# Patient Record
Sex: Female | Born: 1935 | Race: White | Hispanic: No | Smoking: Never smoker
Health system: Southern US, Community
[De-identification: ages and names within clinical notes are randomized; demographics above are authoritative.]

## PROBLEM LIST (undated history)

## (undated) DIAGNOSIS — F0390 Unspecified dementia without behavioral disturbance: Secondary | ICD-10-CM

## (undated) DIAGNOSIS — I10 Essential (primary) hypertension: Secondary | ICD-10-CM

---

## 2004-02-14 ENCOUNTER — Other Ambulatory Visit: Payer: Self-pay

## 2004-02-14 ENCOUNTER — Ambulatory Visit: Payer: Self-pay | Admitting: Orthopaedic Surgery

## 2004-02-18 ENCOUNTER — Ambulatory Visit: Payer: Self-pay | Admitting: Orthopaedic Surgery

## 2004-12-18 ENCOUNTER — Ambulatory Visit: Payer: Self-pay | Admitting: Internal Medicine

## 2005-12-21 ENCOUNTER — Ambulatory Visit: Payer: Self-pay | Admitting: Internal Medicine

## 2007-02-23 ENCOUNTER — Ambulatory Visit: Payer: Self-pay | Admitting: Internal Medicine

## 2008-04-26 ENCOUNTER — Ambulatory Visit: Payer: Self-pay | Admitting: Internal Medicine

## 2009-05-23 ENCOUNTER — Ambulatory Visit: Payer: Self-pay | Admitting: Internal Medicine

## 2010-06-05 ENCOUNTER — Ambulatory Visit: Payer: Self-pay | Admitting: Internal Medicine

## 2011-09-03 ENCOUNTER — Ambulatory Visit: Payer: Self-pay | Admitting: Internal Medicine

## 2012-09-22 ENCOUNTER — Ambulatory Visit: Payer: Self-pay | Admitting: Internal Medicine

## 2013-09-25 ENCOUNTER — Ambulatory Visit: Payer: Self-pay | Admitting: Internal Medicine

## 2016-10-05 ENCOUNTER — Other Ambulatory Visit: Payer: Self-pay | Admitting: Neurology

## 2016-10-05 DIAGNOSIS — R413 Other amnesia: Secondary | ICD-10-CM

## 2016-10-08 ENCOUNTER — Ambulatory Visit
Admission: RE | Admit: 2016-10-08 | Discharge: 2016-10-08 | Disposition: A | Discharge status: Home / Self Care | Payer: Medicare Other | Source: Ambulatory Visit | Attending: Neurology | Admitting: Neurology

## 2016-10-08 ENCOUNTER — Encounter: Payer: Self-pay | Admitting: Radiology

## 2016-10-08 DIAGNOSIS — R413 Other amnesia: Secondary | ICD-10-CM

## 2018-09-06 ENCOUNTER — Emergency Department: Payer: Medicare Other

## 2018-09-06 ENCOUNTER — Other Ambulatory Visit: Payer: Self-pay

## 2018-09-06 ENCOUNTER — Encounter: Payer: Self-pay | Admitting: Emergency Medicine

## 2018-09-06 ENCOUNTER — Emergency Department
Admission: EM | Admit: 2018-09-06 | Discharge: 2018-09-06 | Disposition: A | Discharge status: Home / Self Care | Payer: Medicare Other | Attending: Emergency Medicine | Admitting: Emergency Medicine

## 2018-09-06 DIAGNOSIS — F0390 Unspecified dementia without behavioral disturbance: Secondary | ICD-10-CM | POA: Diagnosis not present

## 2018-09-06 DIAGNOSIS — R51 Headache: Secondary | ICD-10-CM | POA: Diagnosis not present

## 2018-09-06 DIAGNOSIS — R519 Headache, unspecified: Secondary | ICD-10-CM

## 2018-09-06 DIAGNOSIS — I10 Essential (primary) hypertension: Secondary | ICD-10-CM | POA: Insufficient documentation

## 2018-09-06 HISTORY — DX: Essential (primary) hypertension: I10

## 2018-09-06 HISTORY — DX: Unspecified dementia, unspecified severity, without behavioral disturbance, psychotic disturbance, mood disturbance, and anxiety: F03.90

## 2018-09-06 LAB — COMPREHENSIVE METABOLIC PANEL
ALT: 23 U/L (ref 0–44)
AST: 20 U/L (ref 15–41)
Albumin: 4 g/dL (ref 3.5–5.0)
Alkaline Phosphatase: 60 U/L (ref 38–126)
Anion gap: 8 (ref 5–15)
BUN: 25 mg/dL — ABNORMAL HIGH (ref 8–23)
CO2: 24 mmol/L (ref 22–32)
Calcium: 8.9 mg/dL (ref 8.9–10.3)
Chloride: 104 mmol/L (ref 98–111)
Creatinine, Ser: 0.71 mg/dL (ref 0.44–1.00)
GFR calc Af Amer: >60 mL/min (ref >60)
GFR calc non Af Amer: >60 mL/min (ref >60)
Glucose, Bld: 104 mg/dL — ABNORMAL HIGH (ref 70–99)
Potassium: 4.3 mmol/L (ref 3.5–5.1)
Sodium: 136 mmol/L (ref 135–145)
Total Bilirubin: 0.7 mg/dL (ref 0.3–1.2)
Total Protein: 7.3 g/dL (ref 6.5–8.1)

## 2018-09-06 LAB — CBC WITH DIFFERENTIAL/PLATELET
Abs Immature Granulocytes: 0.03 10*3/uL (ref 0.00–0.07)
Basophils Absolute: 0 10*3/uL (ref 0.0–0.1)
Basophils Relative: 0 %
Eosinophils Absolute: 0.1 10*3/uL (ref 0.0–0.5)
Eosinophils Relative: 1 %
HCT: 36.7 % (ref 36.0–46.0)
Hemoglobin: 12 g/dL (ref 12.0–15.0)
Immature Granulocytes: 0 %
Lymphocytes Relative: 9 %
Lymphs Abs: 0.7 10*3/uL (ref 0.7–4.0)
MCH: 32.2 pg (ref 26.0–34.0)
MCHC: 32.7 g/dL (ref 30.0–36.0)
MCV: 98.4 fL (ref 80.0–100.0)
Monocytes Absolute: 0.7 10*3/uL (ref 0.1–1.0)
Monocytes Relative: 9 %
Neutro Abs: 6 10*3/uL (ref 1.7–7.7)
Neutrophils Relative %: 81 %
Platelets: 275 10*3/uL (ref 150–400)
RBC: 3.73 MIL/uL — ABNORMAL LOW (ref 3.87–5.11)
RDW: 13.7 % (ref 11.5–15.5)
WBC: 7.5 10*3/uL (ref 4.0–10.5)
nRBC: 0 % (ref 0.0–0.2)

## 2018-09-06 LAB — URINALYSIS, COMPLETE (UACMP) WITH MICROSCOPIC
Bacteria, UA: NONE SEEN
Bilirubin Urine: NEGATIVE
Glucose, UA: NEGATIVE mg/dL
Hgb urine dipstick: NEGATIVE
Ketones, ur: NEGATIVE mg/dL
Leukocytes,Ua: NEGATIVE
Nitrite: NEGATIVE
Protein, ur: NEGATIVE mg/dL
Specific Gravity, Urine: 1.005 (ref 1.005–1.030)
pH: 6 (ref 5.0–8.0)

## 2018-09-06 NOTE — ED Notes (Signed)
Patient transported to CT 

## 2018-09-06 NOTE — ED Triage Notes (Signed)
Pt presents to ED via POV with her son who states that she has had headaches x 3 weeks, seen at PCP who prescribed Meloxicam without relief. Pt's son states he has been giving her advil and Tylenol without relief. Pt's son states he is healthcare power of attorney.

## 2018-09-06 NOTE — ED Notes (Signed)
Assisted pt to bathroom- pt had a steady gait and needed little assistance

## 2018-09-06 NOTE — ED Provider Notes (Signed)
[facility] Emergency Department Provider Note       Time seen: ----------------------------------------- 11:15 AM on 09/06/2018 -----------------------------------------   I have reviewed the triage vital signs and the nursing notes.  HISTORY   Chief Complaint Headache   HPI Dominique Walton is a 85 y.o. female with a history of dementia and hypertension who presents to the ED for persistent headaches for the past 3 weeks.  She has seen her primary care doctor who prescribed meloxicam without any relief.  Son states he has been giving her Advil and Tylenol without any improvement.  Past Medical History:  Diagnosis Date  . Dementia (HCC)   . Hypertension     There are no active problems to display for this patient.   History reviewed. No pertinent surgical history.  Allergies Patient has no known allergies.  Social History Social History   Tobacco Use  . Smoking status: Never Smoker  . Smokeless tobacco: Never Used  Substance Use Topics  . Alcohol use: Not Currently  . Drug use: Not Currently   Review of Systems Constitutional: Negative for fever. Cardiovascular: Negative for chest pain. Respiratory: Negative for shortness of breath. Gastrointestinal: Negative for abdominal pain, vomiting and diarrhea. Musculoskeletal: Negative for back pain. Skin: Negative for rash. Neurological: Positive for headache  All systems negative/normal/unremarkable except as stated in the HPI  ____________________________________________   PHYSICAL EXAM:  VITAL SIGNS: ED Triage Vitals  Enc Vitals Group     BP 09/06/18 1108 (!) 171/92     Pulse Rate 09/06/18 1108 88     Resp 09/06/18 1108 18     Temp 09/06/18 1108 98.1 F (36.7 C)     Temp Source 09/06/18 1108 Oral     SpO2 09/06/18 1108 100 %     Weight 09/06/18 1103 108 lb 12.8 oz (49.4 kg)     Height 09/06/18 1103 5\' 3"  (1.6 m)     Head Circumference --      Peak Flow --      Pain Score --       Pain Loc --      Pain Edu? --      Excl. in GC? --     Constitutional: Alert but disoriented.. Well appearing and in no distress. Eyes: Conjunctivae are normal. Normal extraocular movements. ENT      Head: Normocephalic and atraumatic.      Nose: No congestion/rhinnorhea.      Mouth/Throat: Mucous membranes are moist.      Neck: No stridor. Cardiovascular: Normal rate, regular rhythm. No murmurs, rubs, or gallops. Respiratory: Normal respiratory effort without tachypnea nor retractions. Breath sounds are clear and equal bilaterally. No wheezes/rales/rhonchi. Gastrointestinal: Soft and nontender. Normal bowel sounds Musculoskeletal: Nontender with normal range of motion in extremities. No lower extremity tenderness nor edema. Neurologic:  Normal speech and language. No gross focal neurologic deficits are appreciated.  Skin:  Skin is warm, dry and intact. No rash noted. Psychiatric: Mood and affect are normal. Speech and behavior are normal.  ____________________________________________  ED COURSE:  As part of my medical decision making, I reviewed the following data within the electronic MEDICAL RECORD NUMBER History obtained from family if available, nursing notes, old chart and ekg, as well as notes from prior ED visits. Patient presented for persistent headache, we will assess with labs and imaging as indicated at this time.   Procedures  Dominique Walton was evaluated in Emergency Department on 09/06/2018 for the symptoms described in the  history of present illness. She was evaluated in the context of the global COVID-19 pandemic, which necessitated consideration that the patient might be at risk for infection with the SARS-CoV-2 virus that causes COVID-19. Institutional protocols and algorithms that pertain to the evaluation of patients at risk for COVID-19 are in a state of rapid change based on information released by regulatory bodies including the CDC and federal and state organizations.  These policies and algorithms were followed during the patient's care in the ED.  ____________________________________________   LABS (pertinent positives/negatives)  Labs Reviewed  CBC WITH DIFFERENTIAL/PLATELET - Abnormal; Notable for the following components:      Result Value   RBC 3.73 (*)    All other components within normal limits  COMPREHENSIVE METABOLIC PANEL - Abnormal; Notable for the following components:   Glucose, Bld 104 (*)    BUN 25 (*)    All other components within normal limits  URINALYSIS, COMPLETE (UACMP) WITH MICROSCOPIC - Abnormal; Notable for the following components:   Color, Urine STRAW (*)    APPearance CLEAR (*)    All other components within normal limits    RADIOLOGY Images were viewed by me  CT head without contrast IMPRESSION: Mild chronic ischemic white matter disease. No acute intracranial abnormality seen. ____________________________________________   DIFFERENTIAL DIAGNOSIS   Dementia, tension headache, migraine, subdural, brain tumor  FINAL ASSESSMENT AND PLAN  Headache   Plan: The patient had presented for persistent headache. Patient's labs did not reveal any acute process. Patient's imaging was negative.  She is cleared for outpatient follow-up, no clear etiology for her headache.   Ulice Dash, MD    Note: This note was generated in part or whole with voice recognition software. Voice recognition is usually quite accurate but there are transcription errors that can and very often do occur. I apologize for any typographical errors that were not detected and corrected.     Emily Filbert, MD 09/06/18 [phone]

## 2018-09-06 NOTE — ED Notes (Signed)
Pt having headaches that come and go for 3 weeks- medications not helping- pts son states that the pt c/o of a bump on the back of her head behind her right ear but he and the dr have not noticed anything- pt has a hx of dementia

## 2018-09-06 NOTE — ED Notes (Signed)
EDP at bedside  

## 2018-09-19 ENCOUNTER — Telehealth: Payer: Self-pay | Admitting: Primary Care

## 2018-09-19 NOTE — Telephone Encounter (Signed)
Talked with Olivia Mackie (daughter-in-law) regarding scheduling Palliative Consult, after answering several questions about Palliative services she wanted to talk with her husband first and call me back to schedule.

## 2018-09-19 NOTE — Telephone Encounter (Signed)
Rec'd call back from [place] and we have scheduled a Telephone Palliative consult for 09/26/18 @ 3 PM.

## 2018-09-26 ENCOUNTER — Other Ambulatory Visit: Payer: Self-pay

## 2018-09-26 ENCOUNTER — Other Ambulatory Visit: Payer: Medicare Other | Admitting: Primary Care

## 2018-09-26 DIAGNOSIS — Z515 Encounter for palliative care: Secondary | ICD-10-CM

## 2018-09-26 NOTE — Progress Notes (Signed)
[facility] Pall[facility] Consult Note Telephone: [phone]  Fax: [phone]   PATIENT NAME: Dominique Walton DOB: 13-Feb-1927 MRN: [id number]  PRIMARY CARE PROVIDER:   Rusty Aus, [place]  REFERRING PROVIDER:  Rusty Aus, MD [place],  [place] [address] [phone]  RESPONSIBLE PARTY:   Extended Emergency Contact Information Primary Emergency Contact: Freiberger,[name] Address: [address]          [place], [place] [address] [name] of [place] Phone: [phone] Relation: Son Secondary Emergency Contact: [name],[name] Address: [address]          [place], [place] [address] [name] of [place] Phone: [phone] Relation: Son  Palliative Care was asked to follow this patient by consultation request of Rusty Aus, MD. This is the initial visit. Present were patient, daughter in law [name], 2 sons [name] and [name],  [name]'s girlfriend and patient.  ASSESSMENT AND RECOMMENDATIONS:   1. Goals of Care: Maximize quality of life and symptom management. Needs immediate help with caregiving issues as well as safety.  2. Symptom Management:   Dementia: Recommend reinitiating of quetiapine 12.5-25 mg at hs and holding namenda for now, see below. Patient developed a head ache and family stopped both these  medications. Today we talked about starting with just the quetiapine at a low dose and titrating up observing for side effects such as headache and vertigo. Family will give this trial 2 weeks. If no untoward effects, I have recommended revisiting for dose adjustment. If behavior is not mitigated, I would recommend a trial of depakote 125 mg bid.   Wandering: Had been tried on quetiapine and namenda but began to complain of a headache and went to ED. Family has stopped medications. However, behaviors have been worsening RE wandering looking for family members, often people who  have already passed on. Or, she is looking for siblings as if they were all children. She exhibits some sundowning behaviors. Education provided on manifestations of dementia.  Education provided RE safety measures such as door alarms because patient becomes frantic if someone is not with her at all times. She is a risk for wandering.  Weekly bathing: Patient has neglected personal hygiene and resists having a weekly bath much of the time. Her daughter in law coaxes her with a weekly bath. Family would like more custodial support.  3. Family /Caregiver/Community Supports: Discussed at length the burden of 24/7 care giving as patient's dementia continues to progress. Family cited she has declined in past several months, not able to be left at home now. The patient states she drives, cooks, does dishes, bathes independently but when asked her age she could not give year or age, and family endorses her inability to do most of these adls. She resists bathing and toilet hygiene assistance and they are exhausted from the care giving demands. We  Discussed placement as an option, and discussed her finances and ability to qualify for medicaid.   Family will research area dementia care facilities and will make application for Medicaid for placement. Her care level will be Assisted/memory care. We discussed Covid and the limitations that nursing homes have put on any visitors, and how they could potentially not see her for a long time. They were reticent to place her and then effectively disappear. They have modest means and may be able to hire someone to provide some respite for the caregivers.  We discussed [facility] (  [phone]) and other day programs. They are not sure if patient would be able to take the stress of a new situation but I encouraged her to look into it.   4. Cognitive / Functional decline: Increasing per family report, now FAST score 6 b-c, also has vascular aspects as her dementia is mixed. Family  states she can no longer be left alone for a few hours due to wandering and anxiety at being alone.She is also getting up in the night at 2,3 4 am and looking for family members. She sometimes does not recognize her caregivers, her sons. She seems to be reporting some hallucinations, thinking there are additional people in the house, especially at night. Patient herself reported ability to drive, pay her bills, cook, bathe and dress self, but this was denied by family. Patient could also not tell me her age or birth year.  5. Advanced Care Directive: Sons [name] and [name] are POA health care and durable. They identified a living will that was made by the patient. They will review that form and I gave them a MOST form to discuss and complete and we can discuss their care goals on our next meeting.  6. Follow up Palliative Care Visit: Palliative care will continue to follow for goals of care clarification and symptom management. Return 2 weeks or prn. We will look at medication effect, placement options, caregiver augmentation and safety measures. We will complete a MOST form and DSS application for medicaid.  Palliative medicine team will continue to support patient, patient's family, and medical team. Visit consisted of counseling and education dealing with the complex and emotionally intense issues of symptom management and palliative care in the setting of serious and potentially life-threatening illness.  I spent 120 minutes providing this consultation,  from 1500 to 1700. More than 50% of the time in this consultation was spent coordinating communication.   HISTORY OF PRESENT ILLNESS:  Dominique Walton is a 85 y.o. year old female with multiple medical problems including dementia with behavioral disturbances, HTN. Palliative Care was asked to help address goals of care.   CODE STATUS: DNR, not sure where it is, will review MOST next visit.  PPS: 40% HOSPICE ELIGIBILITY/DIAGNOSIS: TBD  PAST MEDICAL  HISTORY:  Past Medical History:  Diagnosis Date   Dementia (HCC)    Hypertension     SOCIAL HX:  Social History   Tobacco Use   Smoking status: Never Smoker   Smokeless tobacco: Never Used  Substance Use Topics   Alcohol use: Not Currently    ALLERGIES: No Known Allergies   PERTINENT MEDICATIONS:  No outpatient encounter medications on file as of 09/26/2018.   No facility-administered encounter medications on file as of 09/26/2018.     PHYSICAL EXAM/ROS:   T=98.5 HR= 78 Current and past weights: current weight = 109 lb,  General: NAD, frail appearing, thin, states she dresses self, does dishes, states she is driving. States she does all her bills and business activities. This is all false, she is not able due to dementia. Family goes to MD with patient.  Cardiovascular: no chest pain reported, no edema, + aortic murmur III/IV. Pulmonary: no cough, no increased SOB Abdomen: appetite fair, denies constipation, continent of bowel GU: denies dysuria,  Stress incontinence at times MSK:  no joint deformities, does not use device for ambulation, fall history with 3 recent falls. Skin: no rashes or wounds reported Neurological: Weakness, was in ED with headache, states no headache now and no  knowledge of the ED event but  states no feeling of memory loss or hallucinations. Wandering and states she wants to go home. Wandering in the night in the house looking for people.  FAST Score 6 c.   Marijo FileKathryn M Debi Cousin DNP, AGPCNP-BC

## 2018-11-22 ENCOUNTER — Telehealth: Payer: Self-pay | Admitting: Primary Care

## 2018-11-22 NOTE — Telephone Encounter (Signed)
Email from [facility] scheduling. They are doing fine for now and would like a call in a few months. Will reach out for scheduling in mid Oct. And encouraged them to call if needed.

## 2019-01-26 ENCOUNTER — Other Ambulatory Visit
Admission: RE | Admit: 2019-01-26 | Discharge: 2019-01-26 | Disposition: A | Discharge status: Home / Self Care | Payer: Medicare Other | Source: Ambulatory Visit | Attending: Student | Admitting: Student

## 2019-01-26 DIAGNOSIS — L0390 Cellulitis, unspecified: Secondary | ICD-10-CM | POA: Diagnosis present

## 2019-01-26 LAB — SYNOVIAL CELL COUNT + DIFF, W/ CRYSTALS
Crystals, Fluid: NONE SEEN
Eosinophils-Synovial: 2 %
Lymphocytes-Synovial Fld: 8 %
Monocyte-Macrophage-Synovial Fluid: 5 %
Neutrophil, Synovial: 85 %
WBC, Synovial: 4643 /mm3 — ABNORMAL HIGH (ref 0–200)

## 2019-08-17 ENCOUNTER — Ambulatory Visit: Payer: Medicare PPO | Attending: Internal Medicine

## 2019-08-17 DIAGNOSIS — Z20822 Contact with and (suspected) exposure to covid-19: Secondary | ICD-10-CM

## 2019-08-18 LAB — SARS-COV-2, NAA 2 DAY TAT

## 2019-08-18 LAB — NOVEL CORONAVIRUS, NAA: SARS-CoV-2, NAA: NOT DETECTED

## 2019-11-26 ENCOUNTER — Emergency Department: Payer: Medicare PPO

## 2019-11-26 ENCOUNTER — Inpatient Hospital Stay
Admission: EM | Admit: 2019-11-26 | Discharge: 2019-12-04 | DRG: 602 | Disposition: A | Discharge status: Skilled Nursing Facility | Payer: Medicare PPO | Source: Skilled Nursing Facility | Attending: Internal Medicine | Admitting: Internal Medicine

## 2019-11-26 ENCOUNTER — Encounter: Payer: Self-pay | Admitting: Emergency Medicine

## 2019-11-26 DIAGNOSIS — G301 Alzheimer's disease with late onset: Secondary | ICD-10-CM | POA: Diagnosis not present

## 2019-11-26 DIAGNOSIS — I350 Nonrheumatic aortic (valve) stenosis: Secondary | ICD-10-CM

## 2019-11-26 DIAGNOSIS — I110 Hypertensive heart disease with heart failure: Secondary | ICD-10-CM | POA: Diagnosis present

## 2019-11-26 DIAGNOSIS — I4891 Unspecified atrial fibrillation: Secondary | ICD-10-CM | POA: Diagnosis present

## 2019-11-26 DIAGNOSIS — Z7982 Long term (current) use of aspirin: Secondary | ICD-10-CM

## 2019-11-26 DIAGNOSIS — I252 Old myocardial infarction: Secondary | ICD-10-CM

## 2019-11-26 DIAGNOSIS — E876 Hypokalemia: Secondary | ICD-10-CM | POA: Diagnosis not present

## 2019-11-26 DIAGNOSIS — Z515 Encounter for palliative care: Secondary | ICD-10-CM | POA: Diagnosis not present

## 2019-11-26 DIAGNOSIS — Z20822 Contact with and (suspected) exposure to covid-19: Secondary | ICD-10-CM | POA: Diagnosis present

## 2019-11-26 DIAGNOSIS — S0083XA Contusion of other part of head, initial encounter: Secondary | ICD-10-CM | POA: Diagnosis present

## 2019-11-26 DIAGNOSIS — F0280 Dementia in other diseases classified elsewhere without behavioral disturbance: Secondary | ICD-10-CM

## 2019-11-26 DIAGNOSIS — F0150 Vascular dementia without behavioral disturbance: Secondary | ICD-10-CM | POA: Diagnosis not present

## 2019-11-26 DIAGNOSIS — W19XXXA Unspecified fall, initial encounter: Secondary | ICD-10-CM | POA: Diagnosis present

## 2019-11-26 DIAGNOSIS — I5033 Acute on chronic diastolic (congestive) heart failure: Secondary | ICD-10-CM

## 2019-11-26 DIAGNOSIS — G309 Alzheimer's disease, unspecified: Secondary | ICD-10-CM | POA: Diagnosis present

## 2019-11-26 DIAGNOSIS — J45909 Unspecified asthma, uncomplicated: Secondary | ICD-10-CM | POA: Diagnosis present

## 2019-11-26 DIAGNOSIS — Z6824 Body mass index (BMI) 24.0-24.9, adult: Secondary | ICD-10-CM

## 2019-11-26 DIAGNOSIS — Z79899 Other long term (current) drug therapy: Secondary | ICD-10-CM | POA: Diagnosis not present

## 2019-11-26 DIAGNOSIS — F0281 Dementia in other diseases classified elsewhere with behavioral disturbance: Secondary | ICD-10-CM | POA: Diagnosis present

## 2019-11-26 DIAGNOSIS — R339 Retention of urine, unspecified: Secondary | ICD-10-CM | POA: Diagnosis not present

## 2019-11-26 DIAGNOSIS — I10 Essential (primary) hypertension: Secondary | ICD-10-CM | POA: Diagnosis not present

## 2019-11-26 DIAGNOSIS — E870 Hyperosmolality and hypernatremia: Secondary | ICD-10-CM

## 2019-11-26 DIAGNOSIS — F419 Anxiety disorder, unspecified: Secondary | ICD-10-CM | POA: Diagnosis present

## 2019-11-26 DIAGNOSIS — J9601 Acute respiratory failure with hypoxia: Secondary | ICD-10-CM | POA: Diagnosis present

## 2019-11-26 DIAGNOSIS — D509 Iron deficiency anemia, unspecified: Secondary | ICD-10-CM | POA: Diagnosis present

## 2019-11-26 DIAGNOSIS — R627 Adult failure to thrive: Secondary | ICD-10-CM | POA: Diagnosis not present

## 2019-11-26 DIAGNOSIS — Z9183 Wandering in diseases classified elsewhere: Secondary | ICD-10-CM

## 2019-11-26 DIAGNOSIS — S0011XA Contusion of right eyelid and periocular area, initial encounter: Secondary | ICD-10-CM | POA: Diagnosis present

## 2019-11-26 DIAGNOSIS — F05 Delirium due to known physiological condition: Secondary | ICD-10-CM | POA: Diagnosis present

## 2019-11-26 DIAGNOSIS — L03116 Cellulitis of left lower limb: Secondary | ICD-10-CM | POA: Diagnosis not present

## 2019-11-26 DIAGNOSIS — E871 Hypo-osmolality and hyponatremia: Secondary | ICD-10-CM | POA: Diagnosis not present

## 2019-11-26 DIAGNOSIS — Z66 Do not resuscitate: Secondary | ICD-10-CM | POA: Diagnosis not present

## 2019-11-26 LAB — CBC WITH DIFFERENTIAL/PLATELET
Abs Immature Granulocytes: 0.03 10*3/uL (ref 0.00–0.07)
Basophils Absolute: 0 10*3/uL (ref 0.0–0.1)
Basophils Relative: 1 %
Eosinophils Absolute: 0.2 10*3/uL (ref 0.0–0.5)
Eosinophils Relative: 3 %
HCT: 31.4 % — ABNORMAL LOW (ref 36.0–46.0)
Hemoglobin: 10.6 g/dL — ABNORMAL LOW (ref 12.0–15.0)
Immature Granulocytes: 0 %
Lymphocytes Relative: 8 %
Lymphs Abs: 0.6 10*3/uL — ABNORMAL LOW (ref 0.7–4.0)
MCH: 33.3 pg (ref 26.0–34.0)
MCHC: 33.8 g/dL (ref 30.0–36.0)
MCV: 98.7 fL (ref 80.0–100.0)
Monocytes Absolute: 0.9 10*3/uL (ref 0.1–1.0)
Monocytes Relative: 11 %
Neutro Abs: 6.1 10*3/uL (ref 1.7–7.7)
Neutrophils Relative %: 77 %
Platelets: 224 10*3/uL (ref 150–400)
RBC: 3.18 MIL/uL — ABNORMAL LOW (ref 3.87–5.11)
RDW: 14.9 % (ref 11.5–15.5)
WBC: 7.9 10*3/uL (ref 4.0–10.5)
nRBC: 0 % (ref 0.0–0.2)

## 2019-11-26 LAB — URINALYSIS, COMPLETE (UACMP) WITH MICROSCOPIC
Bilirubin Urine: NEGATIVE
Glucose, UA: NEGATIVE mg/dL
Hgb urine dipstick: NEGATIVE
Ketones, ur: 5 mg/dL — AB
Nitrite: NEGATIVE
Protein, ur: NEGATIVE mg/dL
Specific Gravity, Urine: 1.014 (ref 1.005–1.030)
pH: 5 (ref 5.0–8.0)

## 2019-11-26 LAB — COMPREHENSIVE METABOLIC PANEL
ALT: 22 U/L (ref 0–44)
AST: 29 U/L (ref 15–41)
Albumin: 4 g/dL (ref 3.5–5.0)
Alkaline Phosphatase: 59 U/L (ref 38–126)
Anion gap: 12 (ref 5–15)
BUN: 41 mg/dL — ABNORMAL HIGH (ref 8–23)
CO2: 24 mmol/L (ref 22–32)
Calcium: 9 mg/dL (ref 8.9–10.3)
Chloride: 104 mmol/L (ref 98–111)
Creatinine, Ser: 0.88 mg/dL (ref 0.44–1.00)
GFR calc Af Amer: >60 mL/min (ref >60)
GFR calc non Af Amer: 57 mL/min — ABNORMAL LOW (ref >60)
Glucose, Bld: 100 mg/dL — ABNORMAL HIGH (ref 70–99)
Potassium: 4.2 mmol/L (ref 3.5–5.1)
Sodium: 140 mmol/L (ref 135–145)
Total Bilirubin: 1 mg/dL (ref 0.3–1.2)
Total Protein: 7.7 g/dL (ref 6.5–8.1)

## 2019-11-26 LAB — SARS CORONAVIRUS 2 BY RT PCR (HOSPITAL ORDER, PERFORMED IN ~~LOC~~ HOSPITAL LAB): SARS Coronavirus 2: NEGATIVE

## 2019-11-26 LAB — PROTIME-INR
INR: 0.9 (ref 0.8–1.2)
Prothrombin Time: 12.1 seconds (ref 11.4–15.2)

## 2019-11-26 LAB — APTT: aPTT: 29 seconds (ref 24–36)

## 2019-11-26 LAB — LACTIC ACID, PLASMA: Lactic Acid, Venous: 0.8 mmol/L (ref 0.5–1.9)

## 2019-11-26 MED ORDER — ACETAMINOPHEN 325 MG PO TABS
650.0000 mg | ORAL_TABLET | Freq: Four times a day (QID) | ORAL | Status: DC | PRN
  Administered 2019-11-27 – 2019-12-03 (×2): 650 mg via ORAL
  Filled 2019-11-26 (×2): qty 2

## 2019-11-26 MED ORDER — FUROSEMIDE 40 MG PO TABS
40.0000 mg | ORAL_TABLET | Freq: Every day | ORAL | Status: DC
  Administered 2019-11-26 – 2019-11-28 (×2): 40 mg via ORAL
  Filled 2019-11-26 (×2): qty 1

## 2019-11-26 MED ORDER — SODIUM CHLORIDE 0.9% FLUSH
3.0000 mL | Freq: Two times a day (BID) | INTRAVENOUS | Status: DC
  Administered 2019-11-26 – 2019-12-02 (×10): 3 mL via INTRAVENOUS

## 2019-11-26 MED ORDER — GALANTAMINE HYDROBROMIDE ER 8 MG PO CP24
8.0000 mg | ORAL_CAPSULE | Freq: Every day | ORAL | Status: DC
  Administered 2019-11-26 – 2019-12-04 (×8): 8 mg via ORAL
  Filled 2019-11-26 (×10): qty 1

## 2019-11-26 MED ORDER — QUETIAPINE FUMARATE 25 MG PO TABS
25.0000 mg | ORAL_TABLET | Freq: Every morning | ORAL | Status: DC
  Administered 2019-11-28 – 2019-12-01 (×4): 25 mg via ORAL
  Filled 2019-11-26 (×4): qty 1

## 2019-11-26 MED ORDER — QUETIAPINE FUMARATE 25 MG PO TABS
50.0000 mg | ORAL_TABLET | Freq: Every day | ORAL | Status: DC
  Administered 2019-11-27 – 2019-11-30 (×4): 50 mg via ORAL
  Filled 2019-11-26 (×5): qty 2

## 2019-11-26 MED ORDER — QUETIAPINE FUMARATE 25 MG PO TABS
25.0000 mg | ORAL_TABLET | Freq: Two times a day (BID) | ORAL | Status: DC
  Administered 2019-11-26: 50 mg via ORAL
  Filled 2019-11-26: qty 2

## 2019-11-26 MED ORDER — ALBUTEROL SULFATE HFA 108 (90 BASE) MCG/ACT IN AERS
2.0000 | INHALATION_SPRAY | Freq: Four times a day (QID) | RESPIRATORY_TRACT | Status: DC | PRN
  Filled 2019-11-26: qty 6.7

## 2019-11-26 MED ORDER — ONDANSETRON HCL 4 MG/2ML IJ SOLN: 4.0000 mg | Freq: Four times a day (QID) | INTRAMUSCULAR | Status: DC | PRN

## 2019-11-26 MED ORDER — ALPRAZOLAM 0.25 MG PO TABS
0.2500 mg | ORAL_TABLET | Freq: Two times a day (BID) | ORAL | Status: DC | PRN
  Administered 2019-11-26 – 2019-11-27 (×2): 0.25 mg via ORAL
  Filled 2019-11-26 (×2): qty 1

## 2019-11-26 MED ORDER — QUETIAPINE FUMARATE 25 MG PO TABS
25.0000 mg | ORAL_TABLET | Freq: Once | ORAL | Status: AC | PRN
  Administered 2019-11-26: 25 mg via ORAL
  Filled 2019-11-26: qty 1

## 2019-11-26 MED ORDER — SODIUM CHLORIDE 0.9 % IV SOLN
1.0000 g | Freq: Once | INTRAVENOUS | Status: AC
  Administered 2019-11-26: 1 g via INTRAVENOUS
  Filled 2019-11-26: qty 10

## 2019-11-26 MED ORDER — ACETAMINOPHEN 650 MG RE SUPP: 650.0000 mg | Freq: Four times a day (QID) | RECTAL | Status: DC | PRN

## 2019-11-26 MED ORDER — VITAMIN D3 25 MCG (1000 UNIT) PO TABS
1000.0000 [IU] | ORAL_TABLET | Freq: Every day | ORAL | Status: DC
  Administered 2019-11-28 – 2019-12-04 (×7): 1000 [IU] via ORAL
  Filled 2019-11-26 (×16): qty 1

## 2019-11-26 MED ORDER — CEFAZOLIN SODIUM-DEXTROSE 1-4 GM/50ML-% IV SOLN
1.0000 g | Freq: Three times a day (TID) | INTRAVENOUS | Status: DC
  Administered 2019-11-27 – 2019-11-30 (×10): 1 g via INTRAVENOUS
  Filled 2019-11-26 (×18): qty 50

## 2019-11-26 MED ORDER — VITAMIN B-12 1000 MCG PO TABS
1000.0000 ug | ORAL_TABLET | Freq: Every day | ORAL | Status: DC
  Administered 2019-11-28 – 2019-12-04 (×7): 1000 ug via ORAL
  Filled 2019-11-26 (×9): qty 1

## 2019-11-26 MED ORDER — BENAZEPRIL HCL 10 MG PO TABS
10.0000 mg | ORAL_TABLET | Freq: Every day | ORAL | Status: DC
  Administered 2019-11-26 – 2019-12-04 (×7): 10 mg via ORAL
  Filled 2019-11-26 (×9): qty 1

## 2019-11-26 MED ORDER — ENOXAPARIN SODIUM 40 MG/0.4ML ~~LOC~~ SOLN
40.0000 mg | SUBCUTANEOUS | Status: DC
  Administered 2019-11-27 – 2019-12-03 (×8): 40 mg via SUBCUTANEOUS
  Filled 2019-11-26 (×8): qty 0.4

## 2019-11-26 MED ORDER — TRAZODONE HCL 50 MG PO TABS
50.0000 mg | ORAL_TABLET | Freq: Every evening | ORAL | Status: DC | PRN
  Administered 2019-11-27 – 2019-11-30 (×2): 50 mg via ORAL
  Filled 2019-11-26 (×3): qty 1

## 2019-11-26 MED ORDER — ASPIRIN EC 81 MG PO TBEC
81.0000 mg | DELAYED_RELEASE_TABLET | Freq: Every day | ORAL | Status: DC
  Administered 2019-11-26 – 2019-12-04 (×8): 81 mg via ORAL
  Filled 2019-11-26 (×8): qty 1

## 2019-11-26 MED ORDER — AMLODIPINE BESYLATE 10 MG PO TABS
10.0000 mg | ORAL_TABLET | Freq: Every day | ORAL | Status: DC
  Administered 2019-11-26 – 2019-12-04 (×7): 10 mg via ORAL
  Filled 2019-11-26 (×2): qty 1
  Filled 2019-11-26: qty 2
  Filled 2019-11-26 (×5): qty 1

## 2019-11-26 MED ORDER — ONDANSETRON HCL 4 MG PO TABS: 4.0000 mg | ORAL_TABLET | Freq: Four times a day (QID) | ORAL | Status: DC | PRN

## 2019-11-26 MED ORDER — POLYETHYLENE GLYCOL 3350 17 G PO PACK: 17.0000 g | PACK | Freq: Every day | ORAL | Status: DC | PRN

## 2019-11-26 NOTE — H&P (Signed)
[facility] History and Physical  Dominique Walton DSK:[id number] DOB: 03/10/1927 DOA: 11/26/2019  Referring physician: Dr. Manson Passey PCP: Danella Penton, MD   Chief Complaint: fall  HPI: Dominique Walton is a 86 y.o. female with hx of dementia, hypertension, aortic stenosis, A. fib, who presents after fall.   History obtained via chart due to patient's dementia.  When interviewed at bedside patient states she does not remember falling and is unable to give a clear answer on how her leg feels.  Call made to [facility], dayshift was not present during her fall but they received report that patient had fell.  They states she often wanders.  Per ED provider patient presented after unwitnessed fall from [facility] signs of trauma.  In the ED she underwent basic lab work-up which showed unremarkable CMP and CBC with mild anemia hemoglobin 10.6, normal coags, and CT head & spine which was negative for any acute findings with exception of scalp hematoma.  She also had plain films of chest, hip, lumbar spine, all of which were also negative for acute findings.  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  Remainder of exam is notable for left lower leg findings that were consistent with cellulitis.  She was given 1 g of ceftriaxone in the ED.  She was admitted for further management of her left lower extremity cellulitis.   Review of Systems:  Unable to perform due to condition of patient: Dementia   Past Medical History:  Diagnosis Date  . Dementia (HCC)   . Hypertension    History reviewed. No pertinent surgical history. Social History:  reports that she has never smoked. She has never used smokeless tobacco. She reports previous alcohol use. She reports previous drug use.  No Known Allergies  History reviewed. No pertinent family history.   Prior to Admission medications   Medication Sig Start Date End Date Taking?  Authorizing Provider  acetaminophen (TYLENOL) 325 MG tablet Take 650 mg by mouth 3 (three) times daily.   Yes [provider]  albuterol (VENTOLIN HFA) 108 (90 Base) MCG/ACT inhaler Inhale 2 puffs into the lungs every 6 (six) hours as needed for shortness of breath.   Yes [provider]  ALPRAZolam (XANAX) 0.25 MG tablet Take 0.25 mg by mouth every 12 (twelve) hours as needed for anxiety.   Yes [provider]  amLODipine (NORVASC) 10 MG tablet Take 10 mg by mouth daily.   Yes [provider]  aspirin EC 81 MG tablet Take 81 mg by mouth daily. Swallow whole.   Yes [provider]  benazepril (LOTENSIN) 10 MG tablet Take 10 mg by mouth daily.   Yes [provider]  cetirizine (ZYRTEC) 10 MG tablet Take 10 mg by mouth daily as needed for allergies.   Yes [provider]  cholecalciferol (VITAMIN D) 25 MCG (1000 UNIT) tablet Take 1,000 Units by mouth daily.   Yes [provider]  cloNIDine (CATAPRES) 0.1 MG tablet Take 0.1 mg by mouth every 12 (twelve) hours as needed (SBP >180).   Yes [provider]  Emollient Juanell Fairly) LOTN Apply 1 application topically daily. Apply to bilateral lower extremities   Yes [provider]  Emollient (MINERIN) LOTN Apply 1 application topically daily as needed (dry skin). Apply to bilateral lower extremities   Yes [provider]  furosemide (LASIX) 40 MG tablet Take 40 mg by mouth daily.   Yes  [provider]  galantamine (RAZADYNE ER) 8 MG 24 hr capsule Take 8 mg by mouth daily with breakfast.   Yes [provider]  ibuprofen (ADVIL) 200 MG tablet Take 200 mg by mouth as needed for mild pain.   Yes [provider]  QUEtiapine (SEROQUEL) 25 MG tablet Take 25-50 mg by mouth 2 (two) times daily. 25 mg in the morning and 50 mg at bedtime   Yes [provider]  vitamin B-12 (CYANOCOBALAMIN) 1000 MCG tablet Take 1,000 mcg by mouth daily.    Yes [provider]   Physical Exam: Vitals:   11/26/19 0449  BP: (!) 150/98  Resp: 20  Temp: 98 F (36.7 C)  TempSrc: Axillary  SpO2: 95%    Wt Readings from Last 3 Encounters:  09/06/18 49.4 kg    General:  Appears calm and comfortable Eyes: PERRL, normal lids, irises & conjunctiva ENT: grossly normal hearing, poor oral care Neck: no masses Cardiovascular: RRR, 3/6 systolic murmur. Minimal LE edema Respiratory: CTA bilaterally, no w/r/r. Normal respiratory effort. Abdomen: soft, ntnd Skin: erythema of LLE present over foot up to mid shin with minimal edema as well as skin tear over lateral L thigh Musculoskeletal: grossly normal tone BUE/BLE Psychiatric: anxious affect, speech fluent and appropriate Neurologic: grossly non-focal.          Labs on Admission:  Basic Metabolic Panel: Recent Labs  Lab 11/26/19 0517  NA 140  K 4.2  CL 104  CO2 24  GLUCOSE 100*  BUN 41*  CREATININE 0.88  CALCIUM 9.0   Liver Function Tests: Recent Labs  Lab 11/26/19 0517  AST 29  ALT 22  ALKPHOS 59  BILITOT 1.0  PROT 7.7  ALBUMIN 4.0   No results for input(s): LIPASE, AMYLASE in the last 168 hours. No results for input(s): AMMONIA in the last 168 hours. CBC: Recent Labs  Lab 11/26/19 0517  WBC 7.9  NEUTROABS 6.1  HGB 10.6*  HCT 31.4*  MCV 98.7  PLT 224   Cardiac Enzymes: No results for input(s): CKTOTAL, CKMB, CKMBINDEX, TROPONINI in the last 168 hours.  BNP (last 3 results) No results for input(s): BNP in the last 8760 hours.  ProBNP (last 3 results) No results for input(s): PROBNP in the last 8760 hours.  CBG: No results for input(s): GLUCAP in the last 168 hours.  Radiological Exams on Admission: DG Lumbar Spine Complete  Result Date: 11/26/2019 CLINICAL DATA:  Status post fall with low back pain. EXAM: LUMBAR SPINE - COMPLETE 4+ VIEW COMPARISON:  None. FINDINGS: There is lumbar scoliosis which appears convex towards the right. First degree  anterolisthesis of L4 on L5 noted. Vertebral body heights are well preserved. No fractures identified. Multi level disc space narrowing and endplate spurring noted. This is most advanced at L2-3. Bilateral L4-5 and L5-S1 facet arthropathy. Atherosclerotic calcifications are noted involving the abdominal aorta. IMPRESSION: 1. No acute findings. 2. Lumbar scoliosis and degenerative disc disease. 3. Bilateral L4-5 and L5-S1 facet arthropathy. 4.  Aortic Atherosclerosis (ICD10-I70.0). Electronically Signed   By: Signa Kell M.D.   On: 11/26/2019 05:28   CT Head Wo Contrast  Result Date: 11/26/2019 CLINICAL DATA:  Head trauma.  Falls. EXAM: CT HEAD WITHOUT CONTRAST CT CERVICAL SPINE WITHOUT CONTRAST TECHNIQUE: Multidetector CT imaging of the head and cervical spine was performed following the standard protocol without intravenous contrast. Multiplanar CT image reconstructions of the cervical spine were also generated. COMPARISON:  CT head without contrast 10/03/2018 FINDINGS: CT  HEAD FINDINGS Brain: Atrophy and white matter disease is stable. No acute infarct, hemorrhage, or mass lesion is present. The ventricles are proportionate to the degree of atrophy. The basal ganglia are intact. Insular ribbon is normal bilaterally. The brainstem and cerebellum are within normal limits. Vascular: Atherosclerotic calcifications are present in the cavernous internal carotid arteries bilaterally. No hyperdense vessel is present. Skull: Left frontal scalp hematoma is present. No underlying fracture is present. No significant foreign body is present. Calvarium is intact. No other significant soft tissue injury is present. Sinuses/Orbits: The paranasal sinuses and mastoid air cells are clear. The globes and orbits are within normal limits. CT CERVICAL SPINE FINDINGS Alignment: Grade 1 anterolisthesis is present at C2-3 and C4-5. Cervical lordosis is noted. Skull base and vertebrae: Degenerative changes are noted at C1-2.  Craniocervical junction is normal. Remote healed dens fracture is present. No acute fractures are present. Vertebral body heights are maintained. Soft tissues and spinal canal: No prevertebral fluid or swelling. No visible canal hematoma. Dense atherosclerotic calcifications are present in the carotid bifurcations bilaterally. Soft tissues are otherwise unremarkable. Disc levels: Uncovertebral and facet disease contribute to moderate foraminal narrowing bilaterally at C2-3, C5-6, greatest at C6-7. Upper chest: Lung apices are clear. Thoracic inlet is within normal limits. IMPRESSION: 1. Left frontal scalp hematoma without underlying fracture. 2. Stable atrophy and white matter disease. This likely reflects the sequela of chronic microvascular ischemia. 3. No acute intracranial abnormality or significant interval change. 4. Remote healed dens fracture. 5. Multilevel degenerative changes of the cervical spine as described. 6. No acute fracture or traumatic subluxation in the cervical spine. Electronically Signed   By: Marin Robertshristopher  Mattern M.D.   On: 11/26/2019 05:19   CT Cervical Spine Wo Contrast  Result Date: 11/26/2019 CLINICAL DATA:  Head trauma.  Falls. EXAM: CT HEAD WITHOUT CONTRAST CT CERVICAL SPINE WITHOUT CONTRAST TECHNIQUE: Multidetector CT imaging of the head and cervical spine was performed following the standard protocol without intravenous contrast. Multiplanar CT image reconstructions of the cervical spine were also generated. COMPARISON:  CT head without contrast 10/03/2018 FINDINGS: CT HEAD FINDINGS Brain: Atrophy and white matter disease is stable. No acute infarct, hemorrhage, or mass lesion is present. The ventricles are proportionate to the degree of atrophy. The basal ganglia are intact. Insular ribbon is normal bilaterally. The brainstem and cerebellum are within normal limits. Vascular: Atherosclerotic calcifications are present in the cavernous internal carotid arteries bilaterally. No  hyperdense vessel is present. Skull: Left frontal scalp hematoma is present. No underlying fracture is present. No significant foreign body is present. Calvarium is intact. No other significant soft tissue injury is present. Sinuses/Orbits: The paranasal sinuses and mastoid air cells are clear. The globes and orbits are within normal limits. CT CERVICAL SPINE FINDINGS Alignment: Grade 1 anterolisthesis is present at C2-3 and C4-5. Cervical lordosis is noted. Skull base and vertebrae: Degenerative changes are noted at C1-2. Craniocervical junction is normal. Remote healed dens fracture is present. No acute fractures are present. Vertebral body heights are maintained. Soft tissues and spinal canal: No prevertebral fluid or swelling. No visible canal hematoma. Dense atherosclerotic calcifications are present in the carotid bifurcations bilaterally. Soft tissues are otherwise unremarkable. Disc levels: Uncovertebral and facet disease contribute to moderate foraminal narrowing bilaterally at C2-3, C5-6, greatest at C6-7. Upper chest: Lung apices are clear. Thoracic inlet is within normal limits. IMPRESSION: 1. Left frontal scalp hematoma without underlying fracture. 2. Stable atrophy and white matter disease. This likely reflects the sequela of chronic  microvascular ischemia. 3. No acute intracranial abnormality or significant interval change. 4. Remote healed dens fracture. 5. Multilevel degenerative changes of the cervical spine as described. 6. No acute fracture or traumatic subluxation in the cervical spine. Electronically Signed   By: Marin Roberts M.D.   On: 11/26/2019 05:19   DG Chest Port 1 View  Result Date: 11/26/2019 CLINICAL DATA:  Status post fall.  Low back pain. EXAM: PORTABLE CHEST 1 VIEW COMPARISON:  None FINDINGS: Cardiac enlargement. Aortic atherosclerotic calcifications noted. Mild asymmetric elevation of the right hemidiaphragm. Blunting of the right costophrenic angle is noted which may  reflect a small pleural effusion versus pleuroparenchymal scarring. No interstitial edema or airspace consolidation. IMPRESSION: 1. No acute cardiopulmonary abnormalities. 2. Cardiac enlargement. Electronically Signed   By: Signa Kell M.D.   On: 11/26/2019 05:31   DG Hip Unilat W or Wo Pelvis 2-3 Views Left  Result Date: 11/26/2019 CLINICAL DATA:  Status post fall.  Low back pain. EXAM: DG HIP (WITH OR WITHOUT PELVIS) 2-3V LEFT COMPARISON:  None. FINDINGS: Lumbar scoliosis and degenerative disc disease. Both hips appear located. Bilateral hip joint osteoarthritis is noted, right greater than left. Atherosclerotic calcifications noted. IMPRESSION: 1. No acute findings. 2. Bilateral hip joint osteoarthritis, right greater than left. 3. Lumbar scoliosis and degenerative disc disease. Electronically Signed   By: Signa Kell M.D.   On: 11/26/2019 05:29    EKG: Independently reviewed.  Sinus rhythm, Q waves in V2 and V3, no other ischemic changes, unchanged from prior from September 06, 2018.  Assessment/Plan Active Problems:   Cellulitis of left leg  #Left lower extremity cellulitis Patient presenting with left lower extremity edema as well lateral thigh wound consistent with cellulitis.  Patient requires IV antibiotics due to risk of progressive infection, sepsis, and possibly death without treatment.  Low suspicion for MRSA at this time defer treatment with vancomycin. -Cefazolin IV -Consider escalation to vancomycin versus transition to doxycycline tomorrow pending clinical course  #Mechanical fall ED trauma work-up negative, likely mechanical in the setting of left lower extremity cellulitis and known wandering behaviors with dementia.  Review of chart shows that patient at times combative, unlikely to benefit from PT or OT consult at this time.  #Chronic medical problems Dementia: Continue Seroquel, galantamine, Xanax, aspirin.  Patient is currently full code, given age and comorbidities will  consult transitional care for discussion of CODE STATUS. Hypertension: Continue amlodipine, benazepril, furosemide Asthma: Continue albuterol as needed   Code Status: Full Code, confirmed with Diamantina Monks DVT Prophylaxis: lovenox Family Communication: Son Dominique Walton updated at bedside in the ED Disposition Plan: Inpatient, likely 1-2 days  Time spent: 50 min  Venora Maples MD/[facility]

## 2019-11-26 NOTE — ED Provider Notes (Signed)
[facility] Emergency Department Provider Note  ____________________________________________   First MD Initiated Contact with Patient 11/26/19 [phone]     (approximate)  I have reviewed the triage vital signs and the nursing notes.  Level 5 caveat history view of system limited secondary to Alzheimer's dementia. HISTORY  Chief Complaint Fall   HPI Dominique Walton is a 86 y.o. female with history of dementia and hypertension presents to the emergency department via EMS following [facility]itness[facility].  Patient noted to have a contusion to the left forehead and inferiorly to the right eye..  Patient also noted to have a weeping wound on the left lower extremity per EMS.  Patient admits to hurting all over.     Past Medical History:  Diagnosis Date  . Dementia (HCC)   . Hypertension     There are no problems to display for this patient.   History reviewed. No pertinent surgical history.  Prior to Admission medications   Not on File    Allergies Patient has no known allergies.  History reviewed. No pertinent family history.  Social History Social History   Tobacco Use  . Smoking status: Never Smoker  . Smokeless tobacco: Never Used  Substance Use Topics  . Alcohol use: Not Currently  . Drug use: Not Currently    Review of Systems Constitutional: No fever/chills Eyes: No visual changes. ENT: No sore throat. Cardiovascular: Denies chest pain. Respiratory: Denies shortness of breath. Gastrointestinal: No abdominal pain.  No nausea, no vomiting.  No diarrhea.  No constipation. Genitourinary: Negative for dysuria. Musculoskeletal: Negative for neck pain.  Negative for back pain. Integumentary: Negative for rash.  Positive for left leg wound Neurological: Negative for headaches, focal weakness or numbness.  ____________________________________________   PHYSICAL EXAM:  VITAL SIGNS: ED Triage Vitals [11/26/19 0449]  Enc  Vitals Group     BP (!) 150/98     Pulse      Resp 20     Temp 98 F (36.7 C)     Temp Source Axillary     SpO2 95 %     Weight      Height      Head Circumference      Peak Flow      Pain Score      Pain Loc      Pain Edu?      Excl. in GC?     Constitutional: Alert but confused Eyes: Conjunctivae are normal.  Head: Forehead contusion and swelling noted.,  Contusion noted inferiorly to the right eye Ears:  Healthy appearing ear canals and TMs bilaterally Nose: No congestion/rhinnorhea. Mouth/Throat: Unremarkable oropharynx Neck: No stridor.  No meningeal signs.   Cardiovascular: Normal rate, regular rhythm. Good peripheral circulation. Grossly normal heart sounds. Respiratory: Normal respiratory effort.  No retractions. Gastrointestinal: Soft and nontender. No distention.  Musculoskeletal: Left leg blanching erythema, nonpitting edema with left lateral leg wound as depicted below. Neurologic:  Normal speech and language. No gross focal neurologic deficits are appreciated.  Skin: Left leg blanching erythema as depicted below.  Hot to touch weeping.   Psychiatric: Mood and affect are normal. Speech and behavior are normal.  ____________________________________________   LABS (all labs ordered are listed, but only abnormal results are displayed)  Labs Reviewed  COMPREHENSIVE METABOLIC PANEL - Abnormal; Notable for the following components:      Result Value   Glucose, Bld 100 (*)    BUN 41 (*)  GFR calc non Af Amer 57 (*)    All other components within normal limits  CBC WITH DIFFERENTIAL/PLATELET - Abnormal; Notable for the following components:   RBC 3.18 (*)    Hemoglobin 10.6 (*)    HCT 31.4 (*)    Lymphs Abs 0.6 (*)    All other components within normal limits  CULTURE, BLOOD (SINGLE)  URINE CULTURE  LACTIC ACID, PLASMA  PROTIME-INR  APTT  LACTIC ACID, PLASMA  URINALYSIS, COMPLETE (UACMP) WITH MICROSCOPIC    ____________________________________________  EKG  ED ECG REPORT I, [place] Ale[place] Mohammed, the attending physician, personally viewed and interpreted this ECG.   Date: 11/26/2019  EKG Time: 4:38 AM  Rate: 103  Rhythm: Sinus tachycardia with premature ventricular contraction  Axis: Normal  Intervals: Normal  ST&T Change: None  ____________________________________________  RADIOLOGY I, [place] Braian Tijerina, personally viewed and evaluated these images (plain radiographs) as part of my medical decision making, as well as reviewing the written report by the radiologist.  ED MD interpretation: No acute findings noted on lumbar spine.  CT head revealed left frontal scalp hematoma.  No acute findings on the cervical spine  Official radiology report(s): DG Lumbar Spine Complete  Result Date: 11/26/2019 CLINICAL DATA:  Status post fall with low back pain. EXAM: LUMBAR SPINE - COMPLETE 4+ VIEW COMPARISON:  None. FINDINGS: There is lumbar scoliosis which appears convex towards the right. First degree anterolisthesis of L4 on L5 noted. Vertebral body heights are well preserved. No fractures identified. Multi level disc space narrowing and endplate spurring noted. This is most advanced at L2-3. Bilateral L4-5 and L5-S1 facet arthropathy. Atherosclerotic calcifications are noted involving the abdominal aorta. IMPRESSION: 1. No acute findings. 2. Lumbar scoliosis and degenerative disc disease. 3. Bilateral L4-5 and L5-S1 facet arthropathy. 4.  Aortic Atherosclerosis (ICD10-I70.0). Electronically Signed   By: Signa Kell M.D.   On: 11/26/2019 05:28   CT Head Wo Contrast  Result Date: 11/26/2019 CLINICAL DATA:  Head trauma.  Falls. EXAM: CT HEAD WITHOUT CONTRAST CT CERVICAL SPINE WITHOUT CONTRAST TECHNIQUE: Multidetector CT imaging of the head and cervical spine was performed following the standard protocol without intravenous contrast. Multiplanar CT image reconstructions of the cervical spine were also  generated. COMPARISON:  CT head without contrast 10/03/2018 FINDINGS: CT HEAD FINDINGS Brain: Atrophy and white matter disease is stable. No acute infarct, hemorrhage, or mass lesion is present. The ventricles are proportionate to the degree of atrophy. The basal ganglia are intact. Insular ribbon is normal bilaterally. The brainstem and cerebellum are within normal limits. Vascular: Atherosclerotic calcifications are present in the cavernous internal carotid arteries bilaterally. No hyperdense vessel is present. Skull: Left frontal scalp hematoma is present. No underlying fracture is present. No significant foreign body is present. Calvarium is intact. No other significant soft tissue injury is present. Sinuses/Orbits: The paranasal sinuses and mastoid air cells are clear. The globes and orbits are within normal limits. CT CERVICAL SPINE FINDINGS Alignment: Grade 1 anterolisthesis is present at C2-3 and C4-5. Cervical lordosis is noted. Skull base and vertebrae: Degenerative changes are noted at C1-2. Craniocervical junction is normal. Remote healed dens fracture is present. No acute fractures are present. Vertebral body heights are maintained. Soft tissues and spinal canal: No prevertebral fluid or swelling. No visible canal hematoma. Dense atherosclerotic calcifications are present in the carotid bifurcations bilaterally. Soft tissues are otherwise unremarkable. Disc levels: Uncovertebral and facet disease contribute to moderate foraminal narrowing bilaterally at C2-3, C5-6, greatest at C6-7. Upper chest: Lung  apices are clear. Thoracic inlet is within normal limits. IMPRESSION: 1. Left frontal scalp hematoma without underlying fracture. 2. Stable atrophy and white matter disease. This likely reflects the sequela of chronic microvascular ischemia. 3. No acute intracranial abnormality or significant interval change. 4. Remote healed dens fracture. 5. Multilevel degenerative changes of the cervical spine as  described. 6. No acute fracture or traumatic subluxation in the cervical spine. Electronically Signed   By: Marin Roberts M.D.   On: 11/26/2019 05:19   CT Cervical Spine Wo Contrast  Result Date: 11/26/2019 CLINICAL DATA:  Head trauma.  Falls. EXAM: CT HEAD WITHOUT CONTRAST CT CERVICAL SPINE WITHOUT CONTRAST TECHNIQUE: Multidetector CT imaging of the head and cervical spine was performed following the standard protocol without intravenous contrast. Multiplanar CT image reconstructions of the cervical spine were also generated. COMPARISON:  CT head without contrast 10/03/2018 FINDINGS: CT HEAD FINDINGS Brain: Atrophy and white matter disease is stable. No acute infarct, hemorrhage, or mass lesion is present. The ventricles are proportionate to the degree of atrophy. The basal ganglia are intact. Insular ribbon is normal bilaterally. The brainstem and cerebellum are within normal limits. Vascular: Atherosclerotic calcifications are present in the cavernous internal carotid arteries bilaterally. No hyperdense vessel is present. Skull: Left frontal scalp hematoma is present. No underlying fracture is present. No significant foreign body is present. Calvarium is intact. No other significant soft tissue injury is present. Sinuses/Orbits: The paranasal sinuses and mastoid air cells are clear. The globes and orbits are within normal limits. CT CERVICAL SPINE FINDINGS Alignment: Grade 1 anterolisthesis is present at C2-3 and C4-5. Cervical lordosis is noted. Skull base and vertebrae: Degenerative changes are noted at C1-2. Craniocervical junction is normal. Remote healed dens fracture is present. No acute fractures are present. Vertebral body heights are maintained. Soft tissues and spinal canal: No prevertebral fluid or swelling. No visible canal hematoma. Dense atherosclerotic calcifications are present in the carotid bifurcations bilaterally. Soft tissues are otherwise unremarkable. Disc levels: Uncovertebral  and facet disease contribute to moderate foraminal narrowing bilaterally at C2-3, C5-6, greatest at C6-7. Upper chest: Lung apices are clear. Thoracic inlet is within normal limits. IMPRESSION: 1. Left frontal scalp hematoma without underlying fracture. 2. Stable atrophy and white matter disease. This likely reflects the sequela of chronic microvascular ischemia. 3. No acute intracranial abnormality or significant interval change. 4. Remote healed dens fracture. 5. Multilevel degenerative changes of the cervical spine as described. 6. No acute fracture or traumatic subluxation in the cervical spine. Electronically Signed   By: Marin Roberts M.D.   On: 11/26/2019 05:19   DG Chest Port 1 View  Result Date: 11/26/2019 CLINICAL DATA:  Status post fall.  Low back pain. EXAM: PORTABLE CHEST 1 VIEW COMPARISON:  None FINDINGS: Cardiac enlargement. Aortic atherosclerotic calcifications noted. Mild asymmetric elevation of the right hemidiaphragm. Blunting of the right costophrenic angle is noted which may reflect a small pleural effusion versus pleuroparenchymal scarring. No interstitial edema or airspace consolidation. IMPRESSION: 1. No acute cardiopulmonary abnormalities. 2. Cardiac enlargement. Electronically Signed   By: Signa Kell M.D.   On: 11/26/2019 05:31   DG Hip Unilat W or Wo Pelvis 2-3 Views Left  Result Date: 11/26/2019 CLINICAL DATA:  Status post fall.  Low back pain. EXAM: DG HIP (WITH OR WITHOUT PELVIS) 2-3V LEFT COMPARISON:  None. FINDINGS: Lumbar scoliosis and degenerative disc disease. Both hips appear located. Bilateral hip joint osteoarthritis is noted, right greater than left. Atherosclerotic calcifications noted. IMPRESSION: 1. No acute findings.  2. Bilateral hip joint osteoarthritis, right greater than left. 3. Lumbar scoliosis and degenerative disc disease. Electronically Signed   By: Signa Kellaylor  Stroud M.D.   On: 11/26/2019 05:29       Procedures   ____________________________________________   INITIAL IMPRESSION / MDM / ASSESSMENT AND PLAN / ED COURSE  As part of my medical decision making, I reviewed the following data within the electronic MEDICAL RECORD NUMBER   86 year old female presented with above-stated history and physical exam secondary to unwitnessed fall.  EKG revealed no evidence of ischemia or infarction.  Laboratory data will for hemoglobin of 10.6 hematocrit of 31.4 however no other acute findings.  Patient's left lower leg findings consistent with cellulitis which patient was prescribed IV ceftriaxone.  CT head and cervical spine were performed which revealed left forehead soft tissue hematoma however no other acute findings.  Regarding patient's left lower extremity cellulitis.  Patient given IV ceftriaxone.  Patient subsequently discussed with hospitalist for admission further evaluation and management.  ____________________________________________  FINAL CLINICAL IMPRESSION(S) / ED DIAGNOSES  Final diagnoses:  Left leg cellulitis     MEDICATIONS GIVEN DURING THIS VISIT:  Medications  cefTRIAXone (ROCEPHIN) 1 g in sodium chloride 0.9 % 100 mL IVPB (1 g Intravenous New Bag/Given 11/26/19 0530)     ED Discharge Orders    None      *Please note:  Gerard C Pflug was evaluated in Emergency Department on 11/26/2019 for the symptoms described in the history of present illness. She was evaluated in the context of the global COVID-19 pandemic, which necessitated consideration that the patient might be at risk for infection with the SARS-CoV-2 virus that causes COVID-19. Institutional protocols and algorithms that pertain to the evaluation of patients at risk for COVID-19 are in a state of rapid change based on information released by regulatory bodies including the CDC and federal and state organizations. These policies and algorithms were followed during the patient's care in the ED.  Some ED  evaluations and interventions may be delayed as a result of limited staffing during and after the pandemic.*  Note:  This document was prepared using Dragon voice recognition software and may include unintentional dictation errors.   Darci CurrentBrown, [place] N, MD 11/26/19 [phone]

## 2019-11-26 NOTE — Progress Notes (Signed)
Pharmacy Antibiotic Note  Dominique Walton is a 86 y.o. female admitted on 11/26/2019 with left lower extremity cellulitis.  Pharmacy has been consulted for cefazolin dosing.  Plan: Will start cefazolin 1g IV every 8 hours   Height: 5\' 3"  (160 cm) Weight: 61.6 kg (135 lb 12.8 oz) IBW/kg (Calculated) : 52.4  Temp (24hrs), Avg:98 F (36.7 C), Min:98 F (36.7 C), Max:98 F (36.7 C)  Recent Labs  Lab 11/26/19 0517  WBC 7.9  CREATININE 0.88  LATICACIDVEN 0.8    Estimated Creatinine Clearance: 34.4 mL/min (by C-G formula based on SCr of 0.88 mg/dL).    No Known Allergies  Antimicrobials this admission: 8/23 Ceftriaxone >> x1 8/23 cefazolin>>  Microbiology results: 8/23 BCx: pending 8/23 UCx: pending   Thank you for allowing pharmacy to be a part of this patient's care.  9/23, PharmD, BCPS Clinical Pharmacist 11/26/2019 11:36 AM

## 2019-11-27 ENCOUNTER — Encounter: Payer: Self-pay | Admitting: Family Medicine

## 2019-11-27 LAB — BASIC METABOLIC PANEL
Anion gap: 14 (ref 5–15)
BUN: 32 mg/dL — ABNORMAL HIGH (ref 8–23)
CO2: 26 mmol/L (ref 22–32)
Calcium: 8.5 mg/dL — ABNORMAL LOW (ref 8.9–10.3)
Chloride: 104 mmol/L (ref 98–111)
Creatinine, Ser: 0.93 mg/dL (ref 0.44–1.00)
GFR calc Af Amer: >60 mL/min (ref >60)
GFR calc non Af Amer: 54 mL/min — ABNORMAL LOW (ref >60)
Glucose, Bld: 115 mg/dL — ABNORMAL HIGH (ref 70–99)
Potassium: 3.8 mmol/L (ref 3.5–5.1)
Sodium: 144 mmol/L (ref 135–145)

## 2019-11-27 LAB — CBC
HCT: 26 % — ABNORMAL LOW (ref 36.0–46.0)
Hemoglobin: 8.3 g/dL — ABNORMAL LOW (ref 12.0–15.0)
MCH: 32.7 pg (ref 26.0–34.0)
MCHC: 31.9 g/dL (ref 30.0–36.0)
MCV: 102.4 fL — ABNORMAL HIGH (ref 80.0–100.0)
Platelets: 228 10*3/uL (ref 150–400)
RBC: 2.54 MIL/uL — ABNORMAL LOW (ref 3.87–5.11)
RDW: 15.1 % (ref 11.5–15.5)
WBC: 9.2 10*3/uL (ref 4.0–10.5)
nRBC: 0 % (ref 0.0–0.2)

## 2019-11-27 LAB — URINE CULTURE: Culture: 30000 — AB

## 2019-11-27 MED ORDER — ALPRAZOLAM 0.25 MG PO TABS
0.2500 mg | ORAL_TABLET | Freq: Every day | ORAL | Status: DC | PRN
  Administered 2019-11-28 – 2019-12-02 (×3): 0.25 mg via ORAL
  Filled 2019-11-27 (×3): qty 1

## 2019-11-27 MED ORDER — COLLAGENASE 250 UNIT/GM EX OINT
TOPICAL_OINTMENT | Freq: Every day | CUTANEOUS | Status: DC
  Filled 2019-11-27: qty 30

## 2019-11-27 NOTE — ED Notes (Signed)
Pharmacy called and will send up medication once prepared.

## 2019-11-27 NOTE — Progress Notes (Signed)
PT Cancellation Note  Patient Details Name: Dominique Walton MRN: [id number] DOB: 1926/08/28   Cancelled Treatment:    Reason Eval/Treat Not Completed: Other (comment) Chart reviewed, spoke with nurse prior to attempt.  She reports "She's going to have to wait until tomorrow, we just got her settled down."  Malachi Pro, DPT 11/27/2019, 5:03 PM

## 2019-11-27 NOTE — Consult Note (Addendum)
WOC Nurse Consult Note: Reason for Consult: Consult requested for left leg Wound type: Right posterior leg with partial thickness stasis ulcer; .5X.5X.1cm, yellow and moist, weeping mod amt yellow drainage; Left anterior leg with full thickness wound; 5.5X4cm, 100% tightly adhered brown slough, small amt tan drainage, no odor or fluctuance, posterior leg with same appearance 3X3cm and 3X4cm.  Generalized erythremia surrounding. Dressing procedure/placement/frequency: Topical treatment orders provided for bedside nurses to perform to assist with removal of nonviable tissue as follows: Apply Santyl to left leg wound Q day, then cover with moist gauze and foam dressing.  (Change foam dressing Q 3 days or PRN soiling.) Foam dressing to right posterior leg, change Q 3 days or PRN soiling Please re-consult if further assistance is needed.  Thank-you,  Cammie Mcgee MSN, RN, CWOCN, [place], CNS [phone]

## 2019-11-27 NOTE — ED Notes (Addendum)
Pt not alert enough to take any medications and will not eat. Messaged Admit MD for orders of possible OT evalu

## 2019-11-27 NOTE — ED Notes (Signed)
Pt given breakfast tray. Tray set up for pt

## 2019-11-27 NOTE — Progress Notes (Signed)
Progress Note    Dominique Walton  EKC:[id number] DOB: [birth year]  DOA: 11/26/2019 PCP: Danella Penton, MD      Brief Narrative:    Medical records reviewed and are as summarized below:  Dominique Walton is a 86 y.o. female with medical history significant for dementia, hypertension, anxiety, was brought to the hospital because of a fall while the assisted living facility.  Apparently, she is on the memory care unit of the facility.  She was found to have left leg cellulitis.  She was treated with IV Ancef and analgesics.  Wound care nurse was consulted for wounds on the left leg.      Assessment/Plan:   Active Problems:   Cellulitis of left leg  Cellulitis of left leg Status post fall Wounds on left leg Hypertension Dementia Anxiety   PLAN  Continue IV cefazolin for cellulitis Analgesics as needed for pain Xanax as needed for anxiety.  Admission med rec showed patient was taking Xanax every 12 hours as needed for anxiety at the assisted living facility.  However, her son said she was probably getting it once as needed for anxiety.  He does not want it to be discontinued completely. Continue antihypertensives Consulted wound care nurse for local wound care   Body mass index is 24.06 kg/m.  Diet Order            Diet Heart Room service appropriate? Yes; Fluid consistency: Thin  Diet effective now                      Medications:    amLODipine  10 mg Oral Daily   aspirin EC  81 mg Oral Daily   benazepril  10 mg Oral Daily   cholecalciferol  1,000 Units Oral Daily   enoxaparin (LOVENOX) injection  40 mg Subcutaneous Q24H   furosemide  40 mg Oral Daily   galantamine  8 mg Oral Q breakfast   QUEtiapine  25 mg Oral q AM   And   QUEtiapine  50 mg Oral QHS   sodium chloride flush  3 mL Intravenous Q12H   vitamin B-12  1,000 mcg Oral Daily   Continuous Infusions:   ceFAZolin (ANCEF) IV Stopped (11/27/19 1038)     Anti-infectives (From  admission, onward)   Start     Dose/Rate Route Frequency Ordered Stop   11/26/19 2200  ceFAZolin (ANCEF) IVPB 1 g/50 mL premix        1 g 100 mL/hr over 30 Minutes Intravenous Every 8 hours 11/26/19 1133     11/26/19 0445  cefTRIAXone (ROCEPHIN) 1 g in sodium chloride 0.9 % 100 mL IVPB        1 g 200 mL/hr over 30 Minutes Intravenous  Once 11/26/19 0435 11/26/19 0602             Family Communication/Anticipated D/C date and plan/Code Status   DVT prophylaxis: enoxaparin (LOVENOX) injection 40 mg Start: 11/26/19 2200     Code Status: Full Code  Family Communication: Plan discussed with her son, Mr. Conception Doebler Disposition Plan:    Status is: Inpatient  Remains inpatient appropriate because:IV treatments appropriate due to intensity of illness or inability to take PO and Inpatient level of care appropriate due to severity of illness   Dispo: The patient is from: ALF              Anticipated d/c is to: ALF  Anticipated d/c date is: 2 days              Patient currently is not medically stable to d/c.           Subjective:   Patient was lethargic and was unable to provide any history.  Objective:    Vitals:   11/27/19 0800 11/27/19 0945 11/27/19 1015 11/27/19 1038  BP: (!) 120/57   (!) 138/56  Pulse: 99 95 97 96  Resp:    18  Temp:      TempSrc:      SpO2: 97% 96% 94% 96%  Weight:      Height:       No data found.   Intake/Output Summary (Last 24 hours) at 11/27/2019 1407 Last data filed at 11/27/2019 0102 Gross per 24 hour  Intake 50 ml  Output 1000 ml  Net -950 ml   Filed Weights   11/26/19 1105  Weight: 61.6 kg    Exam:  GEN: NAD SKIN: Wounds with some yellowish drainage on anterior and posterior aspect of distal left leg.  Bruising/ecchymoses on the left side of the forehead. EYES: EOMI ENT: MMM CV: RRR PULM: CTA B ABD: soft, ND, NT, +BS CNS: lethargic, responsive to pain/sternal rub EXT: Bilateral leg edema, left  leg tenderness with erythema   Data Reviewed:   I have personally reviewed following labs and imaging studies:  Labs: Labs show the following:   Basic Metabolic Panel: Recent Labs  Lab 11/26/19 0517 11/27/19 0607  NA 140 144  K 4.2 3.8  CL 104 104  CO2 24 26  GLUCOSE 100* 115*  BUN 41* 32*  CREATININE 0.88 0.93  CALCIUM 9.0 8.5*   GFR Estimated Creatinine Clearance: 32.6 mL/min (by C-G formula based on SCr of 0.93 mg/dL). Liver Function Tests: Recent Labs  Lab 11/26/19 0517  AST 29  ALT 22  ALKPHOS 59  BILITOT 1.0  PROT 7.7  ALBUMIN 4.0   No results for input(s): LIPASE, AMYLASE in the last 168 hours. No results for input(s): AMMONIA in the last 168 hours. Coagulation profile Recent Labs  Lab 11/26/19 0517  INR 0.9    CBC: Recent Labs  Lab 11/26/19 0517 11/27/19 0607  WBC 7.9 9.2  NEUTROABS 6.1  --   HGB 10.6* 8.3*  HCT 31.4* 26.0*  MCV 98.7 102.4*  PLT 224 228   Cardiac Enzymes: No results for input(s): CKTOTAL, CKMB, CKMBINDEX, TROPONINI in the last 168 hours. BNP (last 3 results) No results for input(s): PROBNP in the last 8760 hours. CBG: No results for input(s): GLUCAP in the last 168 hours. D-Dimer: No results for input(s): DDIMER in the last 72 hours. Hgb A1c: No results for input(s): HGBA1C in the last 72 hours. Lipid Profile: No results for input(s): CHOL, HDL, LDLCALC, TRIG, CHOLHDL, LDLDIRECT in the last 72 hours. Thyroid function studies: No results for input(s): TSH, T4TOTAL, T3FREE, THYROIDAB in the last 72 hours.  Invalid input(s): FREET3 Anemia work up: No results for input(s): VITAMINB12, FOLATE, FERRITIN, TIBC, IRON, RETICCTPCT in the last 72 hours. Sepsis Labs: Recent Labs  Lab 11/26/19 0517 11/27/19 0607  WBC 7.9 9.2  LATICACIDVEN 0.8  --     Microbiology Recent Results (from the past 240 hour(s))  Urine culture     Status: Abnormal   Collection Time: 11/26/19  5:16 AM   Specimen: Urine, Random  Result Value  Ref Range Status   Specimen Description   Final    URINE, RANDOM  Performed at [facility], [address], [place], [place] [address]    Special Requests   Final    NONE Performed at [facility], [address] [place], [place] [address]    Culture (A)  Final    30,000 COLONIES/mL MULTIPLE SPECIES PRESENT, SUGGEST RECOLLECTION   Report Status 11/27/2019 FINAL  Final  SARS Coronavirus 2 by RT PCR (hospital order, performed in [facility] hospital lab) Nasopharyngeal Nasopharyngeal Swab     Status: None   Collection Time: 11/26/19  5:16 AM   Specimen: Nasopharyngeal Swab  Result Value Ref Range Status   SARS Coronavirus 2 NEGATIVE NEGATIVE Final    Comment: (NOTE) SARS-CoV-2 target nucleic acids are NOT DETECTED.  The SARS-CoV-2 RNA is generally detectable in upper and lower respiratory specimens during the acute phase of infection. The lowest concentration of SARS-CoV-2 viral copies this assay can detect is 250 copies / mL. A negative result does not preclude SARS-CoV-2 infection and should not be used as the sole basis for treatment or other patient management decisions.  A negative result may occur with improper specimen collection / handling, submission of specimen other than nasopharyngeal swab, presence of viral mutation(s) within the areas targeted by this assay, and inadequate number of viral copies (<250 copies / mL). A negative result must be combined with clinical observations, patient history, and epidemiological information.  Fact Sheet for Patients:   BoilerBrush.com.cy  Fact Sheet for Healthcare Providers: https://pope.com/  This test is not yet approved or  cleared by the [place] FDA and has been authorized for detection and/or diagnosis of SARS-CoV-2 by FDA under an Emergency Use Authorization (EUA).  This EUA will remain in effect (meaning this test can be used) for the duration of  the COVID-19 declaration under Section 564(b)(1) of the Act, 21 U.S.C. section 360bbb-3(b)(1), unless the authorization is terminated or revoked sooner.  Performed at [facility], [address] [place], [place] [address]   Blood culture (routine single)     Status: None (Preliminary result)   Collection Time: 11/26/19  5:17 AM   Specimen: BLOOD  Result Value Ref Range Status   Specimen Description BLOOD LEFT FA  Final   Special Requests   Final    BOTTLES DRAWN AEROBIC AND ANAEROBIC Blood Culture results may not be optimal due to an excessive volume of blood received in culture bottles   Culture   Final    NO GROWTH 1 DAY Performed at [facility], [address] [place], [place] [address]    Report Status PENDING  Incomplete    Procedures and diagnostic studies:  DG Lumbar Spine Complete  Result Date: 11/26/2019 CLINICAL DATA:  Status post fall with low back pain. EXAM: LUMBAR SPINE - COMPLETE 4+ VIEW COMPARISON:  None. FINDINGS: There is lumbar scoliosis which appears convex towards the right. First degree anterolisthesis of L4 on L5 noted. Vertebral body heights are well preserved. No fractures identified. Multi level disc space narrowing and endplate spurring noted. This is most advanced at L2-3. Bilateral L4-5 and L5-S1 facet arthropathy. Atherosclerotic calcifications are noted involving the abdominal aorta. IMPRESSION: 1. No acute findings. 2. Lumbar scoliosis and degenerative disc disease. 3. Bilateral L4-5 and L5-S1 facet arthropathy. 4.  Aortic Atherosclerosis (ICD10-I70.0). Electronically Signed   By: Signa Kell M.D.   On: 11/26/2019 05:28   CT Head Wo Contrast  Result Date: 11/26/2019 CLINICAL DATA:  Head trauma.  Falls. EXAM: CT HEAD WITHOUT CONTRAST CT CERVICAL SPINE  WITHOUT CONTRAST TECHNIQUE: Multidetector CT imaging of the head and cervical spine was performed following the standard protocol without intravenous contrast. Multiplanar CT image  reconstructions of the cervical spine were also generated. COMPARISON:  CT head without contrast 10/03/2018 FINDINGS: CT HEAD FINDINGS Brain: Atrophy and white matter disease is stable. No acute infarct, hemorrhage, or mass lesion is present. The ventricles are proportionate to the degree of atrophy. The basal ganglia are intact. Insular ribbon is normal bilaterally. The brainstem and cerebellum are within normal limits. Vascular: Atherosclerotic calcifications are present in the cavernous internal carotid arteries bilaterally. No hyperdense vessel is present. Skull: Left frontal scalp hematoma is present. No underlying fracture is present. No significant foreign body is present. Calvarium is intact. No other significant soft tissue injury is present. Sinuses/Orbits: The paranasal sinuses and mastoid air cells are clear. The globes and orbits are within normal limits. CT CERVICAL SPINE FINDINGS Alignment: Grade 1 anterolisthesis is present at C2-3 and C4-5. Cervical lordosis is noted. Skull base and vertebrae: Degenerative changes are noted at C1-2. Craniocervical junction is normal. Remote healed dens fracture is present. No acute fractures are present. Vertebral body heights are maintained. Soft tissues and spinal canal: No prevertebral fluid or swelling. No visible canal hematoma. Dense atherosclerotic calcifications are present in the carotid bifurcations bilaterally. Soft tissues are otherwise unremarkable. Disc levels: Uncovertebral and facet disease contribute to moderate foraminal narrowing bilaterally at C2-3, C5-6, greatest at C6-7. Upper chest: Lung apices are clear. Thoracic inlet is within normal limits. IMPRESSION: 1. Left frontal scalp hematoma without underlying fracture. 2. Stable atrophy and white matter disease. This likely reflects the sequela of chronic microvascular ischemia. 3. No acute intracranial abnormality or significant interval change. 4. Remote healed dens fracture. 5. Multilevel  degenerative changes of the cervical spine as described. 6. No acute fracture or traumatic subluxation in the cervical spine. Electronically Signed   By: Marin Robertshristopher  Mattern M.D.   On: 11/26/2019 05:19   CT Cervical Spine Wo Contrast  Result Date: 11/26/2019 CLINICAL DATA:  Head trauma.  Falls. EXAM: CT HEAD WITHOUT CONTRAST CT CERVICAL SPINE WITHOUT CONTRAST TECHNIQUE: Multidetector CT imaging of the head and cervical spine was performed following the standard protocol without intravenous contrast. Multiplanar CT image reconstructions of the cervical spine were also generated. COMPARISON:  CT head without contrast 10/03/2018 FINDINGS: CT HEAD FINDINGS Brain: Atrophy and white matter disease is stable. No acute infarct, hemorrhage, or mass lesion is present. The ventricles are proportionate to the degree of atrophy. The basal ganglia are intact. Insular ribbon is normal bilaterally. The brainstem and cerebellum are within normal limits. Vascular: Atherosclerotic calcifications are present in the cavernous internal carotid arteries bilaterally. No hyperdense vessel is present. Skull: Left frontal scalp hematoma is present. No underlying fracture is present. No significant foreign body is present. Calvarium is intact. No other significant soft tissue injury is present. Sinuses/Orbits: The paranasal sinuses and mastoid air cells are clear. The globes and orbits are within normal limits. CT CERVICAL SPINE FINDINGS Alignment: Grade 1 anterolisthesis is present at C2-3 and C4-5. Cervical lordosis is noted. Skull base and vertebrae: Degenerative changes are noted at C1-2. Craniocervical junction is normal. Remote healed dens fracture is present. No acute fractures are present. Vertebral body heights are maintained. Soft tissues and spinal canal: No prevertebral fluid or swelling. No visible canal hematoma. Dense atherosclerotic calcifications are present in the carotid bifurcations bilaterally. Soft tissues are  otherwise unremarkable. Disc levels: Uncovertebral and facet disease contribute to moderate foraminal narrowing  bilaterally at C2-3, C5-6, greatest at C6-7. Upper chest: Lung apices are clear. Thoracic inlet is within normal limits. IMPRESSION: 1. Left frontal scalp hematoma without underlying fracture. 2. Stable atrophy and white matter disease. This likely reflects the sequela of chronic microvascular ischemia. 3. No acute intracranial abnormality or significant interval change. 4. Remote healed dens fracture. 5. Multilevel degenerative changes of the cervical spine as described. 6. No acute fracture or traumatic subluxation in the cervical spine. Electronically Signed   By: Marin Roberts M.D.   On: 11/26/2019 05:19   DG Chest Port 1 View  Result Date: 11/26/2019 CLINICAL DATA:  Status post fall.  Low back pain. EXAM: PORTABLE CHEST 1 VIEW COMPARISON:  None FINDINGS: Cardiac enlargement. Aortic atherosclerotic calcifications noted. Mild asymmetric elevation of the right hemidiaphragm. Blunting of the right costophrenic angle is noted which may reflect a small pleural effusion versus pleuroparenchymal scarring. No interstitial edema or airspace consolidation. IMPRESSION: 1. No acute cardiopulmonary abnormalities. 2. Cardiac enlargement. Electronically Signed   By: Signa Kell M.D.   On: 11/26/2019 05:31   DG Hip Unilat W or Wo Pelvis 2-3 Views Left  Result Date: 11/26/2019 CLINICAL DATA:  Status post fall.  Low back pain. EXAM: DG HIP (WITH OR WITHOUT PELVIS) 2-3V LEFT COMPARISON:  None. FINDINGS: Lumbar scoliosis and degenerative disc disease. Both hips appear located. Bilateral hip joint osteoarthritis is noted, right greater than left. Atherosclerotic calcifications noted. IMPRESSION: 1. No acute findings. 2. Bilateral hip joint osteoarthritis, right greater than left. 3. Lumbar scoliosis and degenerative disc disease. Electronically Signed   By: Signa Kell M.D.   On: 11/26/2019 05:29                LOS: 1 day   Anneka Studer  [facility]   Pager on [email or web address]. If 7PM-7AM, please contact night-coverage at [email or web address]     11/27/2019, 2:07 PM

## 2019-11-27 NOTE — ED Notes (Signed)
Pt is alert to painful stimuli 

## 2019-11-27 NOTE — Clinical Social Work Note (Signed)
CSW acknowledges consult for advanced directives. Chaplain would need to be consulted for this but patient has dementia so they would be unable to provide this service. Per outpatient palliative note from 09/26/18: "Sons Dorinda Hill and Clide Cliff are POA health care and durable. They identified a living will that was made by the patient." We do not have a copy of this living will scanned into the chart.  Charlynn Court, CSW [phone]

## 2019-11-27 NOTE — ED Notes (Signed)
Pt's son at bedside at this time.

## 2019-11-27 NOTE — Evaluation (Signed)
Occupational Therapy Evaluation Patient Details Name: Dominique Walton MRN: [id number] DOB: [birth year] Today's Date: 11/27/2019    History of Present Illness 86 y.o. female with history of dementia and hypertension presents to the emergency department via EMS following unwitnessed fall from [facility].  Patient noted to have a contusion to the left forehead and inferiorly to the right eye..  Patient also noted to have a weeping wound on the left lower extremity per EMS.  Patient admits to hurting all over.   Clinical Impression   Patient presenting with decreased cognition at baseline, decreased I in self care, balance, functional mobility/transfers, strength, endurance, and safety awareness. Patient's son present who reports pt is from [facility] memory care where she has 24/7 supervision/assist PTA. Pt ambulates without use of AD and has not had a fall in several months per caregiver. Pt does need assistance with hygiene secondary to cognition. Limited evaluation secondary to lethargy. OT placing warm cloth in pt's hand with initially hand over hand to initiate task and pt never opening eyes. OT removes B hand mitts to inspect skin and strength. Pt's becomes alert and starts pulling at clothing and upset over need to urinate and thinks clothing is wet. She then falls back asleep while therapist reapplies hand mitts. Pt difficult to redirect and when she is alert becomes very restless and confused. Patient currently functioning min - max A for self care tasks. Patient will benefit from acute OT to increase overall independence in the areas of ADLs, functional mobility, and safety awareness in order to safely discharge back to [facility] with follow up HHOT to address functional deficits.    Follow Up Recommendations  Home health OT;Supervision/Assistance - 24 hour    Equipment Recommendations  None recommended by OT    Recommendations for Other Services Other (comment) (none at this time)      Precautions / Restrictions Precautions Precautions: Fall      Mobility Bed Mobility Overal bed mobility: Needs Assistance Bed Mobility: Rolling Rolling: Min assist         General bed mobility comments: min A overall  Transfers   General transfer comment: defer secondary to lethargy        ADL either performed or assessed with clinical judgement   ADL Overall ADL's : Needs assistance/impaired      General ADL Comments: Pt did wash face when warm cloth placed in hand and given commands to do so but pt does not open eyes. Pt's level of assist would likely vary depending on pt's level of alertness and cognition from min - max A overall.     Vision Baseline Vision/History: Wears glasses Wears Glasses: Reading only Patient Visual Report: No change from baseline              Pertinent Vitals/Pain Pain Assessment: Faces Faces Pain Scale: Hurts a little bit Pain Descriptors / Indicators: Discomfort;Guarding Pain Intervention(s): Limited activity within patient's tolerance;Monitored during session;Repositioned     Hand Dominance Right   Extremity/Trunk Assessment Upper Extremity Assessment Upper Extremity Assessment: Generalized weakness;Difficult to assess due to impaired cognition   Lower Extremity Assessment Lower Extremity Assessment: Defer to PT evaluation       Communication Communication Communication: No difficulties   Cognition Arousal/Alertness: Lethargic;Suspect due to medications Behavior During Therapy: Restless;Impulsive Overall Cognitive Status: History of cognitive impairments - at baseline      General Comments: Son present and needing education regarding OT purpose and why therapist was present to see pt today  Home Living Family/patient expects to be discharged to:: Skilled nursing facility      Additional Comments: [facility] - memory care unit      Prior Functioning/Environment Level of Independence: Needs  assistance  Gait / Transfers Assistance Needed: pt did not use AD for ambulation and just "wandered about" ADL's / Homemaking Assistance Needed: Son reports pt needing some assistance for hygiene secondary to cognition Communication / Swallowing Assistance Needed: none per son Comments: Son present and information obtained from him about PLOF        OT Problem List: Decreased strength;Decreased coordination;Pain;Decreased cognition;Decreased activity tolerance;Decreased safety awareness;Impaired balance (sitting and/or standing);Decreased knowledge of use of DME or AE;Decreased knowledge of precautions      OT Treatment/Interventions: Self-care/ADL training;Therapeutic exercise;Therapeutic activities;Cognitive remediation/compensation;Energy conservation;Patient/family education;DME and/or AE instruction;Balance training    OT Goals(Current goals can be found in the care plan section) Acute Rehab OT Goals Patient Stated Goal: to return to [facility] OT Goal Formulation: With family Time For Goal Achievement: 12/11/19 Potential to Achieve Goals: Fair ADL Goals Pt Will Perform Eating: with set-up Pt Will Perform Grooming: with set-up;sitting Pt Will Transfer to Toilet: with min guard assist;ambulating Pt Will Perform Toileting - Clothing Manipulation and hygiene: with min guard assist;sit to/from stand  OT Frequency: Min 1X/week   Barriers to D/C: Other (comment)  none known at this time          AM-PAC OT "6 Clicks" Daily Activity     Outcome Measure Help from another person eating meals?: A Lot Help from another person taking care of personal grooming?: A Lot Help from another person toileting, which includes using toliet, bedpan, or urinal?: Total Help from another person bathing (including washing, rinsing, drying)?: A Lot Help from another person to put on and taking off regular upper body clothing?: A Lot Help from another person to put on and taking off regular lower  body clothing?: Total 6 Click Score: 10   End of Session Equipment Utilized During Treatment: Oxygen Nurse Communication: Mobility status;Precautions  Activity Tolerance: Patient limited by lethargy;Other (comment) (cognition) Patient left: in bed;with call bell/phone within reach;with bed alarm set;with family/visitor present;with restraints reapplied (B hand mitts)  OT Visit Diagnosis: Muscle weakness (generalized) (M62.81);History of falling (Z91.81)                Time: 6256-3893 OT Time Calculation (min): 23 min Charges:  OT General Charges $OT Visit: 1 Visit OT Evaluation $OT Eval Low Complexity: 1 Low OT Treatments $Self Care/Home Management : 8-22 mins  Jackquline Denmark, MS, OTR/L , CBIS ascom [phone]  11/27/19, 2:33 PM

## 2019-11-28 DIAGNOSIS — G301 Alzheimer's disease with late onset: Secondary | ICD-10-CM

## 2019-11-28 DIAGNOSIS — I10 Essential (primary) hypertension: Secondary | ICD-10-CM

## 2019-11-28 DIAGNOSIS — F0280 Dementia in other diseases classified elsewhere without behavioral disturbance: Secondary | ICD-10-CM

## 2019-11-28 LAB — IRON AND TIBC
Iron: 14 ug/dL — ABNORMAL LOW (ref 28–170)
Saturation Ratios: 5 % — ABNORMAL LOW (ref 10.4–31.8)
TIBC: 259 ug/dL (ref 250–450)
UIBC: 245 ug/dL

## 2019-11-28 LAB — VITAMIN B12: Vitamin B-12: 3645 pg/mL — ABNORMAL HIGH (ref 180–914)

## 2019-11-28 MED ORDER — DOXYCYCLINE HYCLATE 100 MG PO TABS
100.0000 mg | ORAL_TABLET | Freq: Two times a day (BID) | ORAL | Status: DC
  Administered 2019-11-28 – 2019-12-04 (×12): 100 mg via ORAL
  Filled 2019-11-28 (×13): qty 1

## 2019-11-28 MED ORDER — SODIUM CHLORIDE 0.9 % IV SOLN
INTRAVENOUS | Status: DC | PRN
  Administered 2019-11-28 – 2019-12-01 (×2): 250 mL via INTRAVENOUS

## 2019-11-28 NOTE — Progress Notes (Signed)
PROGRESS NOTE    Dominique Walton  AXK:[id number] DOB: 1927/02/19 DOA: 11/26/2019 PCP: Rusty Aus, MD   Chief complaint.  Left leg redness and pain. Brief Narrative: Dominique Walton is a 86 y.o. female with medical history significant for dementia, hypertension, anxiety, was brought to the hospital because of a fall while the assisted living facility.  Apparently, she is on the memory care unit of the facility.  She was found to have left leg cellulitis.  She was treated with IV Ancef and analgesics.  Wound care nurse was consulted for wounds on the left leg.  8/25.  Left leg cellulitis not improving, added doxycycline to cover MRSA.  Talk with patient son, patient's DO NOT RESUSCITATE status.   Assessment & Plan:   Active Problems:   Cellulitis of left leg  #1.  Left leg cellulitis. No evidence of abscess.  Condition does not seem to be improving.  Added doxycycline in addition to cefazolin.  Also check a procalcitonin level.  2.  Alzheimer dementia. Patient is quite confused, this appears to be her baseline.  3.  Essential hypertension.  Continue home medicines.    DVT prophylaxis: Lovenox  Code Status: DNR Family Communication: Discussed with patient and son, all questions answered.  Disposition Plan:  . Patient came from: ALF            . Anticipated d/c place: ?  . Barriers to d/c OR conditions which need to be met to effect a safe d/c:   Consultants:   None  Procedures: None Antimicrobials:  Cefazolin and doxycycline.  Subjective: Patient is very confused.  No agitation.  No fever chills.  Poor appetite, no nausea vomiting or diarrhea.  Objective: Vitals:   11/27/19 1547 11/27/19 2018 11/28/19 0511 11/28/19 1217  BP: 129/70 134/74 (!) 154/81 132/77  Pulse: (!) 106 84 99 83  Resp: 20 20 (!) 22 16  Temp: 98.9 F (37.2 C) 98.1 F (36.7 C) 98.3 F (36.8 C) 98.2 F (36.8 C)  TempSrc: Oral Oral Oral   SpO2: 91% 93%  94%  Weight:      Height:         Intake/Output Summary (Last 24 hours) at 11/28/2019 1255 Last data filed at 11/28/2019 0131 Gross per 24 hour  Intake 0 ml  Output 250 ml  Net -250 ml   Filed Weights   11/26/19 1105  Weight: 61.6 kg    Examination:  General exam: Appears calm and comfortable  Respiratory system: Clear to auscultation. Respiratory effort normal. Cardiovascular system: S1 & S2 heard, RRR. No JVD, murmurs, rubs, gallops or clicks. No pedal edema. Gastrointestinal system: Abdomen is nondistended, soft and nontender. No organomegaly or masses felt. Normal bowel sounds heard. Central nervous system: Alert and oriented x1. No focal neurological deficits. Extremities: Left leg still swelling and red, tender to touch.     Data Reviewed: I have personally reviewed following labs and imaging studies  CBC: Recent Labs  Lab 11/26/19 0517 11/27/19 0607  WBC 7.9 9.2  NEUTROABS 6.1  --   HGB 10.6* 8.3*  HCT 31.4* 26.0*  MCV 98.7 102.4*  PLT 224 786   Basic Metabolic Panel: Recent Labs  Lab 11/26/19 0517 11/27/19 0607  NA 140 144  K 4.2 3.8  CL 104 104  CO2 24 26  GLUCOSE 100* 115*  BUN 41* 32*  CREATININE 0.88 0.93  CALCIUM 9.0 8.5*   GFR: Estimated Creatinine Clearance: 32.6 mL/min (by C-G formula based on SCr  of 0.93 mg/dL). Liver Function Tests: Recent Labs  Lab 11/26/19 0517  AST 29  ALT 22  ALKPHOS 59  BILITOT 1.0  PROT 7.7  ALBUMIN 4.0   No results for input(s): LIPASE, AMYLASE in the last 168 hours. No results for input(s): AMMONIA in the last 168 hours. Coagulation Profile: Recent Labs  Lab 11/26/19 0517  INR 0.9   Cardiac Enzymes: No results for input(s): CKTOTAL, CKMB, CKMBINDEX, TROPONINI in the last 168 hours. BNP (last 3 results) No results for input(s): PROBNP in the last 8760 hours. HbA1C: No results for input(s): HGBA1C in the last 72 hours. CBG: No results for input(s): GLUCAP in the last 168 hours. Lipid Profile: No results for input(s): CHOL,  HDL, LDLCALC, TRIG, CHOLHDL, LDLDIRECT in the last 72 hours. Thyroid Function Tests: No results for input(s): TSH, T4TOTAL, FREET4, T3FREE, THYROIDAB in the last 72 hours. Anemia Panel: No results for input(s): VITAMINB12, FOLATE, FERRITIN, TIBC, IRON, RETICCTPCT in the last 72 hours. Sepsis Labs: Recent Labs  Lab 11/26/19 0517  LATICACIDVEN 0.8    Recent Results (from the past 240 hour(s))  Urine culture     Status: Abnormal   Collection Time: 11/26/19  5:16 AM   Specimen: Urine, Random  Result Value Ref Range Status   Specimen Description   Final    URINE, RANDOM Performed at [facility], [address], [place], [place] [address]    Special Requests   Final    NONE Performed at [facility], [address] [place], [place] [address]    Culture (A)  Final    30,000 COLONIES/mL MULTIPLE SPECIES PRESENT, SUGGEST RECOLLECTION   Report Status 11/27/2019 FINAL  Final  SARS Coronavirus 2 by RT PCR (hospital order, performed in [place] hospital lab) Nasopharyngeal Nasopharyngeal Swab     Status: None   Collection Time: 11/26/19  5:16 AM   Specimen: Nasopharyngeal Swab  Result Value Ref Range Status   SARS Coronavirus 2 NEGATIVE NEGATIVE Final    Comment: (NOTE) SARS-CoV-2 target nucleic acids are NOT DETECTED.  The SARS-CoV-2 RNA is generally detectable in upper and lower respiratory specimens during the acute phase of infection. The lowest concentration of SARS-CoV-2 viral copies this assay can detect is 250 copies / mL. A negative result does not preclude SARS-CoV-2 infection and should not be used as the sole basis for treatment or other patient management decisions.  A negative result may occur with improper specimen collection / handling, submission of specimen other than nasopharyngeal swab, presence of viral mutation(s) within the areas targeted by this assay, and inadequate number of viral copies (<250 copies / mL). A negative result must be  combined with clinical observations, patient history, and epidemiological information.  Fact Sheet for Patients:   StrictlyIdeas.no  Fact Sheet for Healthcare Providers: BankingDealers.co.za  This test is not yet approved or  cleared by the [place] FDA and has been authorized for detection and/or diagnosis of SARS-CoV-2 by FDA under an Emergency Use Authorization (EUA).  This EUA will remain in effect (meaning this test can be used) for the duration of the COVID-19 declaration under Section 564(b)(1) of the Act, 21 U.S.C. section 360bbb-3(b)(1), unless the authorization is terminated or revoked sooner.  Performed at [facility], [address] [place],  [address]   Blood culture (routine single)     Status: None (Preliminary result)   Collection Time: 11/26/19  5:17 AM   Specimen: BLOOD  Result Value Ref Range Status  Specimen Description BLOOD LEFT FA  Final   Special Requests   Final    BOTTLES DRAWN AEROBIC AND ANAEROBIC Blood Culture results may not be optimal due to an excessive volume of blood received in culture bottles   Culture   Final    NO GROWTH 2 DAYS Performed at [facility], [address] [place], [place] [address]    Report Status PENDING  Incomplete         Radiology Studies: No results found.      Scheduled Meds: . amLODipine  10 mg Oral Daily  . aspirin EC  81 mg Oral Daily  . benazepril  10 mg Oral Daily  . cholecalciferol  1,000 Units Oral Daily  . collagenase   Topical Daily  . doxycycline  100 mg Oral Q12H  . enoxaparin (LOVENOX) injection  40 mg Subcutaneous Q24H  . furosemide  40 mg Oral Daily  . galantamine  8 mg Oral Q breakfast  . QUEtiapine  25 mg Oral q AM   And  . QUEtiapine  50 mg Oral QHS  . sodium chloride flush  3 mL Intravenous Q12H  . vitamin B-12  1,000 mcg Oral Daily   Continuous Infusions: .  ceFAZolin (ANCEF) IV 1 g (11/28/19 7158)      LOS: 2 days    Time spent: 34 minutes    Sharen Hones, MD [facility]   To contact the attending provider between 7A-7P or the covering provider during after hours 7P-7A, please log into the web site [email or web address] and access using universal [place] password for that web site. If you do not have the password, please call the hospital operator.  11/28/2019, 12:55 PM

## 2019-11-28 NOTE — Progress Notes (Signed)
Patient transferred to Low bed per safety protocols. O2 @ 4 L [place]; alarm activiated, possessions within reach; no distress noted; perewick connected to suction; positioned for comfort. Windy Carina, RN 8:13 PM 11/28/2019

## 2019-11-28 NOTE — Evaluation (Signed)
Physical Therapy Evaluation Patient Details Name: Dominique Walton MRN: [id number] DOB: Aug 31, 1926 Today's Date: 11/28/2019   History of Present Illness  86 y.o. female with history of dementia and hypertension presents to the emergency department via EMS following unwitnessed fall from [facility].  Patient noted to have a contusion to the left forehead and inferiorly to the right eye..  Patient also noted to have a weeping wound on the left lower extremity per EMS.  Patient admits to hurting all over.  Clinical Impression  Patient awake, CNA at bedside (for hygiene, peri-care) upon arrival to room.  Patient oriented to self only; follows some simple commands, optimized with gestures and therapist demonstration.  Generally weak and deconditioned throughout all extremities; strength grossly at least 3-/5 throughout.  Currently requiring mod/max assist for bed mobility; min/mod assist for unsupported sitting balance for multi-directional sway.  Limited awareness of limits of stability and overall safety needs.  Mod SOB with transition to upright and minimal exertion; requiring/requesting return to supine after 2-3 min siting.  Sats 85-86% with light activity on 4L; requiring return to supine (elevated HOB) and 60-90 sec of facilitated pursed lip breathing for recovery to >90%.  RN informed/aware. Would benefit from skilled PT to address above deficits and promote optimal return to PLOF.; recommend transition to STR upon discharge from acute hospitalization.     Follow Up Recommendations SNF    Equipment Recommendations       Recommendations for Other Services       Precautions / Restrictions Precautions Precautions: Fall Restrictions Weight Bearing Restrictions: No      Mobility  Bed Mobility Overal bed mobility: Needs Assistance Bed Mobility: Rolling;Supine to Sit;Sit to Supine Rolling: Mod assist   Supine to sit: Mod assist Sit to supine: Max assist   General bed mobility comments:  min assist for unsupported sitting balance with transition to upright  Transfers                 General transfer comment: unsafe/unable  Ambulation/Gait             General Gait Details: unsafe/unable  Stairs            Wheelchair Mobility    Modified Rankin (Stroke Patients Only)       Balance Overall balance assessment: Needs assistance Sitting-balance support: No upper extremity supported;Feet supported Sitting balance-Leahy Scale: Poor                                       Pertinent Vitals/Pain Pain Assessment: Faces Faces Pain Scale: Hurts little more Pain Location: L LE Pain Descriptors / Indicators: Discomfort;Guarding;Grimacing Pain Intervention(s): Limited activity within patient's tolerance;Monitored during session;Repositioned    Home Living Family/patient expects to be discharged to:: Skilled nursing facility                 Additional Comments: [facility] - [facility] care unit    Prior Function Level of Independence: Needs assistance   Gait / Transfers Assistance Needed: pt did not use AD for ambulation and just "wandered about" facility  ADL's / Homemaking Assistance Needed: Son reports pt needing some assistance for hygiene secondary to cognition  Comments: Son present and information obtained from him about PLOF     Hand Dominance        Extremity/Trunk Assessment   Upper Extremity Assessment Upper Extremity Assessment: Generalized weakness (grossly at least 3+/5  throughout)    Lower Extremity Assessment Lower Extremity Assessment: Generalized weakness (grossly at least 3-/5 throughout; L LE wound bandaged, bilat heels slightly boggy (floated end of session, RN informed))       Communication   Communication: No difficulties  Cognition Arousal/Alertness: Lethargic Behavior During Therapy: Restless Overall Cognitive Status: History of cognitive impairments - at baseline                                  General Comments: patient oriented to self only; follows simple commands with gestures/demonstration from therapist      General Comments      Exercises Other Exercises Other Exercises: Rolling bilat, mod assist; hand-over-hand for UE placement. Other Exercises: Unsupported sitting balance, min/mod assist for multi-directional sway.  Limited awareness of limits of stability and overall safety needs.  Mod SOB with transition to upright and minimal exertion; requiring/requesting return to supine after 2-3 min siting.  Sats 85-86% with light activity on 4L; requiring return to supine (elevated HOB) and 60-90 sec of facilitated pursed lip breathing for recovery to >90%.   Assessment/Plan    PT Assessment Patient needs continued PT services  PT Problem List Decreased strength;Decreased activity tolerance;Decreased balance;Decreased mobility;Decreased knowledge of use of DME;Decreased safety awareness;Decreased knowledge of precautions;Cardiopulmonary status limiting activity       PT Treatment Interventions DME instruction;Gait training;Functional mobility training;Therapeutic activities;Therapeutic exercise;Balance training;Patient/family education;Cognitive remediation    PT Goals (Current goals can be found in the Care Plan section)  Acute Rehab PT Goals PT Goal Formulation: Patient unable to participate in goal setting Time For Goal Achievement: 12/12/19 Potential to Achieve Goals: Fair    Frequency Min 2X/week   Barriers to discharge        Co-evaluation               AM-PAC PT "6 Clicks" Mobility  Outcome Measure Help needed turning from your back to your side while in a flat bed without using bedrails?: A Lot Help needed moving from lying on your back to sitting on the side of a flat bed without using bedrails?: A Lot Help needed moving to and from a bed to a chair (including a wheelchair)?: Total Help needed standing up from a chair using your arms  (e.g., wheelchair or bedside chair)?: Total Help needed to walk in hospital room?: Total Help needed climbing 3-5 steps with a railing? : Total 6 Click Score: 8    End of Session Equipment Utilized During Treatment: Gait belt;Oxygen Activity Tolerance: Patient limited by fatigue Patient left: in bed;with bed alarm set;with nursing/sitter in room (mittens reapplied) Nurse Communication: Mobility status PT Visit Diagnosis: Muscle weakness (generalized) (M62.81);Difficulty in walking, not elsewhere classified (R26.2);History of falling (Z91.81)    Time: 8101-7510 PT Time Calculation (min) (ACUTE ONLY): 28 min   Charges:   PT Evaluation $PT Eval Moderate Complexity: 1 Mod PT Treatments $Therapeutic Activity: 8-22 mins        Hannan Hutmacher H. Manson Passey, PT, DPT, NCS 11/28/19, 10:01 PM [phone]

## 2019-11-28 NOTE — TOC Initial Note (Signed)
Transition of Care [facility]) - Initial/Assessment Note    Patient Details  Name: Dominique Walton MRN: [id number] Date of Birth: [birth year]  Transition of Care [facility]) CM/SW Contact:    Margarito Liner, LCSW Phone Number: 11/28/2019, 10:28 AM  Clinical Narrative:  CSW spoke with Ander Slade at [facility]. She confirmed patient lives on their Memory Care Unit. Received notification from [facility] representative that patient is active with them for PT and RN. Will continue to follow patient and facilitate return to [facility] when stable.               Expected Discharge Plan: Memory Care Barriers to Discharge: Continued Medical Work up   Patient Goals and CMS Choice     Choice offered to / list presented to : NA  Expected Discharge Plan and Services Expected Discharge Plan: Memory Care     Post Acute Care Choice: Resumption of Svcs/PTA Provider Living arrangements for the past 2 months: Assisted Living Facility (Memory Care)                                      Prior Living Arrangements/Services Living arrangements for the past 2 months: Assisted Living Facility (Memory Care) Lives with:: Facility Resident Patient language and need for interpreter reviewed:: Yes Do you feel safe going back to the place where you live?: Yes      Need for Family Participation in Patient Care: Yes (Comment) Care giver support system in place?: Yes (comment) Current home services: Home PT, Home RN Criminal Activity/Legal Involvement Pertinent to Current Situation/Hospitalization: No - Comment as needed  Activities of Daily Living Home Assistive Devices/Equipment: Walker (specify type) ADL Screening (condition at time of admission) Patient's cognitive ability adequate to safely complete daily activities?: No Is the patient deaf or have difficulty hearing?: No Does the patient have difficulty seeing, even when wearing glasses/contacts?: No Does the patient have difficulty concentrating,  remembering, or making decisions?: Yes Patient able to express need for assistance with ADLs?: No Does the patient have difficulty dressing or bathing?: Yes Independently performs ADLs?: No Communication: Needs assistance Is this a change from baseline?: Pre-admission baseline Dressing (OT): Needs assistance Is this a change from baseline?: Pre-admission baseline Grooming: Dependent Is this a change from baseline?: Pre-admission baseline Feeding: Dependent Is this a change from baseline?: Pre-admission baseline Bathing: Dependent Is this a change from baseline?: Pre-admission baseline Toileting: Dependent Is this a change from baseline?: Pre-admission baseline In/Out Bed: Dependent Is this a change from baseline?: Pre-admission baseline Walks in Home: Dependent Is this a change from baseline?: Pre-admission baseline Does the patient have difficulty walking or climbing stairs?: Yes Weakness of Legs: Both Weakness of Arms/Hands: None  Permission Sought/Granted Permission sought to share information with : Photographer granted to share info w AGENCY: Diamantina Monks, [facility]        Emotional Assessment Appearance:: Appears stated age Attitude/Demeanor/Rapport: Unable to Assess Affect (typically observed): Unable to Assess Orientation: : Oriented to Self Alcohol / Substance Use: Not Applicable Psych Involvement: No (comment)  Admission diagnosis:  Cellulitis of left leg [L03.116] Left leg cellulitis [L03.116] Patient Active Problem List   Diagnosis Date Noted  . Cellulitis of left leg 11/26/2019   PCP:  Danella Penton, MD Pharmacy:  No Pharmacies Listed    Social Determinants of Health (SDOH) Interventions  Readmission Risk Interventions No flowsheet data found.

## 2019-11-29 LAB — CBC WITH DIFFERENTIAL/PLATELET
Abs Immature Granulocytes: 0.06 10*3/uL (ref 0.00–0.07)
Basophils Absolute: 0 10*3/uL (ref 0.0–0.1)
Basophils Relative: 0 %
Eosinophils Absolute: 0.1 10*3/uL (ref 0.0–0.5)
Eosinophils Relative: 1 %
HCT: 28.1 % — ABNORMAL LOW (ref 36.0–46.0)
Hemoglobin: 9 g/dL — ABNORMAL LOW (ref 12.0–15.0)
Immature Granulocytes: 1 %
Lymphocytes Relative: 7 %
Lymphs Abs: 0.7 10*3/uL (ref 0.7–4.0)
MCH: 32.7 pg (ref 26.0–34.0)
MCHC: 32 g/dL (ref 30.0–36.0)
MCV: 102.2 fL — ABNORMAL HIGH (ref 80.0–100.0)
Monocytes Absolute: 0.8 10*3/uL (ref 0.1–1.0)
Monocytes Relative: 9 %
Neutro Abs: 8.1 10*3/uL — ABNORMAL HIGH (ref 1.7–7.7)
Neutrophils Relative %: 82 %
Platelets: 221 10*3/uL (ref 150–400)
RBC: 2.75 MIL/uL — ABNORMAL LOW (ref 3.87–5.11)
RDW: 14.8 % (ref 11.5–15.5)
WBC: 9.9 10*3/uL (ref 4.0–10.5)
nRBC: 0 % (ref 0.0–0.2)

## 2019-11-29 LAB — BASIC METABOLIC PANEL
Anion gap: 10 (ref 5–15)
BUN: 28 mg/dL — ABNORMAL HIGH (ref 8–23)
CO2: 32 mmol/L (ref 22–32)
Calcium: 8.7 mg/dL — ABNORMAL LOW (ref 8.9–10.3)
Chloride: 109 mmol/L (ref 98–111)
Creatinine, Ser: 0.64 mg/dL (ref 0.44–1.00)
GFR calc Af Amer: >60 mL/min (ref >60)
GFR calc non Af Amer: >60 mL/min (ref >60)
Glucose, Bld: 120 mg/dL — ABNORMAL HIGH (ref 70–99)
Potassium: 3.8 mmol/L (ref 3.5–5.1)
Sodium: 151 mmol/L — ABNORMAL HIGH (ref 135–145)

## 2019-11-29 LAB — MAGNESIUM: Magnesium: 2.5 mg/dL — ABNORMAL HIGH (ref 1.7–2.4)

## 2019-11-29 LAB — MRSA PCR SCREENING: MRSA by PCR: NEGATIVE

## 2019-11-29 LAB — PROCALCITONIN: Procalcitonin: 0.13 ng/mL

## 2019-11-29 MED ORDER — SODIUM CHLORIDE 0.9 % IV SOLN
300.0000 mg | Freq: Once | INTRAVENOUS | Status: AC
  Administered 2019-11-29: 300 mg via INTRAVENOUS
  Filled 2019-11-29: qty 15

## 2019-11-29 MED ORDER — METOLAZONE 5 MG PO TABS
5.0000 mg | ORAL_TABLET | Freq: Once | ORAL | Status: AC
  Administered 2019-11-29: 5 mg via ORAL
  Filled 2019-11-29: qty 1

## 2019-11-29 MED ORDER — LORAZEPAM 2 MG/ML IJ SOLN
2.0000 mg | Freq: Once | INTRAMUSCULAR | Status: DC
  Filled 2019-11-29: qty 1

## 2019-11-29 MED ORDER — HALOPERIDOL LACTATE 5 MG/ML IJ SOLN
5.0000 mg | Freq: Once | INTRAMUSCULAR | Status: AC
  Administered 2019-11-29: 5 mg via INTRAMUSCULAR
  Filled 2019-11-29: qty 1

## 2019-11-29 NOTE — Care Management Important Message (Signed)
Important Message  Patient Details  Name: Dominique Walton MRN: [id number] Date of Birth: [birth year]   Medicare Important Message Given:  Yes     [name] 11/29/2019, 1:30 PM

## 2019-11-29 NOTE — Progress Notes (Addendum)
Pharmacy Antibiotic Note  Dominique Walton is a 86 y.o. female admitted on 11/26/2019 with left lower extremity cellulitis.  Pharmacy has been consulted for cefazolin dosing.  Day 4 IV antibiotics; WBC trending up, but still WNL and patient remains afebrile. Patient's renal function remains stable.  8/25 PO doxycycline added to cover MRSA d/t left leg cellulitis not improving.   Plan: Will continue cefazolin 1g IV every 8 hours  -MD planning to switch to oral abx at time of discharge  Height: 5\' 3"  (160 cm) Weight: 61.6 kg (135 lb 12.8 oz) IBW/kg (Calculated) : 52.4  Temp (24hrs), Avg:97.9 F (36.6 C), Min:97.4 F (36.3 C), Max:98.4 F (36.9 C)  Recent Labs  Lab 11/26/19 0517 11/27/19 0607 11/29/19 0536  WBC 7.9 9.2 9.9  CREATININE 0.88 0.93 0.64  LATICACIDVEN 0.8  --   --     Estimated Creatinine Clearance: 37.9 mL/min (by C-G formula based on SCr of 0.64 mg/dL).    No Known Allergies  Antimicrobials this admission: 8/23 Ceftriaxone x1 8/23 cefazolin>> 8/25 doxycycline>>  Microbiology results: 8/23 BCx: NGTD 8/23 UCx: multiple species, suggest recollect  Thank you for allowing pharmacy to be a part of this patient's care.  9/23, PharmD Pharmacy Resident  11/29/2019 1:47 PM

## 2019-11-29 NOTE — Progress Notes (Signed)
SLP Cancellation Note  Patient Details Name: TZIVIA ONEIL MRN: [id number] DOB: [birth year]   Cancelled treatment:       Reason Eval/Treat Not Completed: SLP screened, no needs identified, will sign off  Sabrina Arriaga B. Dreama Saa M.S., CCC-SLP, [facility] Speech-Language Pathologist Rehabilitation Services Office [phone]   Lariya Kinzie Dreama Saa 11/29/2019, 8:01 AM

## 2019-11-29 NOTE — Progress Notes (Signed)
PROGRESS NOTE    Dominique Walton  VOZ:[id number] DOB: [birth year] DOA: 11/26/2019 PCP: Dominique Aus, MD   Chief complaint.  Left leg swelling and pain.  Brief Narrative:  Dominique Walton a 86 y.o.femalewith medical history significant for dementia, hypertension, anxiety, was brought to the hospital because of a fall while the assisted living facility. Apparently, she is on the memory care unit of the facility. She was found to have left leg cellulitis. She was treated with IV Ancef and analgesics. Wound care nurse was consulted for wounds on the left leg.  8/25.  Left leg cellulitis not improving, added doxycycline to cover MRSA.  Talk with patient son, patient's DO NOT RESUSCITATE status.   Assessment & Plan:   Active Problems:   Cellulitis of left leg   Alzheimer's dementia ([place])   Essential hypertension  #1.  Left leg cellulitis. Condition gradually improving.  Procalcitonin level 0.13.  Continue oral doxycycline and IV cefazolin.  Planning to transfer to oral antibiotics at time of discharge.  2.  Iron deficient anemia. Patient has significant iron deficiency, will give IV iron.  #3.  Hypernatremia. Likely secondary to poor p.o. intake intake with additional oral Lasix.  Discontinued oral Lasix, will give metolazone instead for leg edema.  4.  Essential hypertension. Continues on medicines.  5.  Alzheimer disease.   DVT prophylaxis: Lovenox  Code Status: DNR Family Communication: Discussed with patient son and daugher-in-law, all questions answered.  Disposition Plan:   Patient came from: ALF                                                                                                                              Anticipated d/c place: ?   Barriers to d/c OR conditions which need to be met to effect a safe d/c:   Consultants:   None  Procedures: None Antimicrobials:  Cefazolin and doxycycline   Subjective: Patient has a poor appetite, but  no nausea vomiting.  Still has leg edema, bottle less redness and warmness. No fever or chills. No abdominal pain or nausea vomiting.  Objective: Vitals:   11/28/19 0511 11/28/19 1217 11/28/19 1918 11/29/19 0603  BP: (!) 154/81 132/77 131/79 (!) 121/56  Pulse: 99 83 94 90  Resp: (!) 22 16 (!) 24 20  Temp: 98.3 F (36.8 C) 98.2 F (36.8 C) (!) 97.4 F (36.3 C) 98.4 F (36.9 C)  TempSrc: Oral  Oral   SpO2:  94% 97% 92%  Weight:      Height:        Intake/Output Summary (Last 24 hours) at 11/29/2019 1316 Last data filed at 11/29/2019 0900 Gross per 24 hour  Intake 199.75 ml  Output 450 ml  Net -250.25 ml   Filed Weights   11/26/19 1105  Weight: 61.6 kg    Examination:  General exam: Appears calm and comfortable. Ill-appearing Respiratory system: Clear to auscultation. Respiratory effort normal. Cardiovascular system: Regular No JVD,  murmurs, rubs, gallops or clicks. 1+ pedal edema. Gastrointestinal system: Abdomen is nondistended, soft and nontender. No organomegaly or masses felt. Normal bowel sounds heard. Central nervous system: Alert and oriented x1. No focal neurological deficits. Extremities: Left leg tenderness is better, less warm Skin: No rashes, lesions or ulcers Psychiatry: Mood & affect appropriate.     Data Reviewed: I have personally reviewed following labs and imaging studies  CBC: Recent Labs  Lab 11/26/19 0517 11/27/19 0607 11/29/19 0536  WBC 7.9 9.2 9.9  NEUTROABS 6.1  --  8.1*  HGB 10.6* 8.3* 9.0*  HCT 31.4* 26.0* 28.1*  MCV 98.7 102.4* 102.2*  PLT 224 228 035   Basic Metabolic Panel: Recent Labs  Lab 11/26/19 0517 11/27/19 0607 11/29/19 0536  NA 140 144 151*  K 4.2 3.8 3.8  CL 104 104 109  CO2 24 26 32  GLUCOSE 100* 115* 120*  BUN 41* 32* 28*  CREATININE 0.88 0.93 0.64  CALCIUM 9.0 8.5* 8.7*  MG  --   --  2.5*   GFR: Estimated Creatinine Clearance: 37.9 mL/min (by C-G formula based on SCr of 0.64 mg/dL). Liver Function  Tests: Recent Labs  Lab 11/26/19 0517  AST 29  ALT 22  ALKPHOS 59  BILITOT 1.0  PROT 7.7  ALBUMIN 4.0   No results for input(s): LIPASE, AMYLASE in the last 168 hours. No results for input(s): AMMONIA in the last 168 hours. Coagulation Profile: Recent Labs  Lab 11/26/19 0517  INR 0.9   Cardiac Enzymes: No results for input(s): CKTOTAL, CKMB, CKMBINDEX, TROPONINI in the last 168 hours. BNP (last 3 results) No results for input(s): PROBNP in the last 8760 hours. HbA1C: No results for input(s): HGBA1C in the last 72 hours. CBG: No results for input(s): GLUCAP in the last 168 hours. Lipid Profile: No results for input(s): CHOL, HDL, LDLCALC, TRIG, CHOLHDL, LDLDIRECT in the last 72 hours. Thyroid Function Tests: No results for input(s): TSH, T4TOTAL, FREET4, T3FREE, THYROIDAB in the last 72 hours. Anemia Panel: Recent Labs    11/28/19 1309  VITAMINB12 3,645*  TIBC 259  IRON 14*   Sepsis Labs: Recent Labs  Lab 11/26/19 0517 11/29/19 0536  PROCALCITON  --  0.13  LATICACIDVEN 0.8  --     Recent Results (from the past 240 hour(s))  Urine culture     Status: Abnormal   Collection Time: 11/26/19  5:16 AM   Specimen: Urine, Random  Result Value Ref Range Status   Specimen Description   Final    URINE, RANDOM Performed at [facility], [address] [place], [place] [address]    Special Requests   Final    NONE Performed at [facility], [address] [place], [place] [address]    Culture (A)  Final    30,000 COLONIES/mL MULTIPLE SPECIES PRESENT, SUGGEST RECOLLECTION   Report Status 11/27/2019 FINAL  Final  SARS Coronavirus 2 by RT PCR (hospital order, performed in [place] hospital lab) Nasopharyngeal Nasopharyngeal Swab     Status: None   Collection Time: 11/26/19  5:16 AM   Specimen: Nasopharyngeal Swab  Result Value Ref Range Status   SARS Coronavirus 2 NEGATIVE NEGATIVE Final    Comment: (NOTE) SARS-CoV-2 target nucleic acids  are NOT DETECTED.  The SARS-CoV-2 RNA is generally detectable in upper and lower respiratory specimens during the acute phase of infection. The lowest concentration of SARS-CoV-2 viral copies this assay can detect is 250 copies / mL. A negative result does  not preclude SARS-CoV-2 infection and should not be used as the sole basis for treatment or other patient management decisions.  A negative result may occur with improper specimen collection / handling, submission of specimen other than nasopharyngeal swab, presence of viral mutation(s) within the areas targeted by this assay, and inadequate number of viral copies (<250 copies / mL). A negative result must be combined with clinical observations, patient history, and epidemiological information.  Fact Sheet for Patients:   StrictlyIdeas.no  Fact Sheet for Healthcare Providers: BankingDealers.co.za  This test is not yet approved or  cleared by the [place] FDA and has been authorized for detection and/or diagnosis of SARS-CoV-2 by FDA under an Emergency Use Authorization (EUA).  This EUA will remain in effect (meaning this test can be used) for the duration of the COVID-19 declaration under Section 564(b)(1) of the Act, 21 U.S.C. section 360bbb-3(b)(1), unless the authorization is terminated or revoked sooner.  Performed at [facility], [address] [place], [place] [address]   Blood culture (routine single)     Status: None (Preliminary result)   Collection Time: 11/26/19  5:17 AM   Specimen: BLOOD  Result Value Ref Range Status   Specimen Description BLOOD LEFT FA  Final   Special Requests   Final    BOTTLES DRAWN AEROBIC AND ANAEROBIC Blood Culture results may not be optimal due to an excessive volume of blood received in culture bottles   Culture   Final    NO GROWTH 3 DAYS Performed at [facility], [address], [place], [place] [address]     Report Status PENDING  Incomplete         Radiology Studies: No results found.      Scheduled Meds: . amLODipine  10 mg Oral Daily  . aspirin EC  81 mg Oral Daily  . benazepril  10 mg Oral Daily  . cholecalciferol  1,000 Units Oral Daily  . collagenase   Topical Daily  . doxycycline  100 mg Oral Q12H  . enoxaparin (LOVENOX) injection  40 mg Subcutaneous Q24H  . galantamine  8 mg Oral Q breakfast  . metolazone  5 mg Oral Once  . QUEtiapine  25 mg Oral q AM   And  . QUEtiapine  50 mg Oral QHS  . sodium chloride flush  3 mL Intravenous Q12H  . vitamin B-12  1,000 mcg Oral Daily   Continuous Infusions: . sodium chloride Stopped (11/28/19 2353)  .  ceFAZolin (ANCEF) IV 1 g (11/29/19 0507)  . iron sucrose (VENOFER) IVPB ([facility])       LOS: 3 days    Time spent: 28 minutes    Sharen Hones, MD [facility]   To contact the attending provider between 7A-7P or the covering provider during after hours 7P-7A, please log into the web site [email or web address] and access using universal [place] password for that web site. If you do not have the password, please call the hospital operator.  11/29/2019, 1:16 PM

## 2019-11-29 NOTE — NC FL2 (Signed)
[place] MEDICAID FL2 LEVEL OF CARE SCREENING TOOL     IDENTIFICATION  Patient Name: Dominique Walton Birthdate: [birth year] Sex: female Admission Date (Current Location): 11/26/2019  [facility] and [place] Number:  Chiropodist and Address:  [facility], [address], [place], [place] [address]      Provider Number: [id number]  Attending Physician Name and Address:  Marrion Coy, MD  Relative Name and Phone Number:       Current Level of Care: Hospital Recommended Level of Care: Skilled Nursing Facility Prior Approval Number:    Date Approved/Denied:   PASRR Number: [id number]  Discharge Plan: SNF    Current Diagnoses: Patient Active Problem List   Diagnosis Date Noted   Alzheimer's dementia (HCC) 11/28/2019   Essential hypertension 11/28/2019   Cellulitis of left leg 11/26/2019    Orientation RESPIRATION BLADDER Height & Weight     Self  O2 (Nasal Canula 4 L) Incontinent, External catheter Weight: 135 lb 12.8 oz (61.6 kg) Height:  5\' 3"  (160 cm)  BEHAVIORAL SYMPTOMS/MOOD NEUROLOGICAL BOWEL NUTRITION STATUS  Other (Comment) (Anxiety)  (Dementia) Incontinent Diet DYS 2  AMBULATORY STATUS COMMUNICATION OF NEEDS Skin   Extensive Assist Verbally Other (Comment) (Cellulitis on left lower leg (foam).)                       Personal Care Assistance Level of Assistance  Bathing, Feeding, Dressing Bathing Assistance: Maximum assistance Feeding assistance: Limited assistance Dressing Assistance: Maximum assistance     Functional Limitations Info  Sight, Hearing, Speech Sight Info: Adequate Hearing Info: Adequate Speech Info: Impaired (Slurred/Dysarthria)    SPECIAL CARE FACTORS FREQUENCY  PT (By licensed PT), OT (By licensed OT)     PT Frequency: 5 x week OT Frequency: 5 x week            Contractures Contractures Info: Not present    Additional Factors Info  Code Status, Allergies Code Status Info:  DNR Allergies Info: NKDA           Current Medications (11/29/2019):  This is the current hospital active medication list Current Facility-Administered Medications  Medication Dose Route Frequency Provider Last Rate Last Admin   0.9 %  sodium chloride infusion   Intravenous PRN 12/01/2019, MD   Stopped at 11/28/19 2353   acetaminophen (TYLENOL) tablet 650 mg  650 mg Oral Q6H PRN 11/30/19, MD   650 mg at 11/27/19 1737   Or   acetaminophen (TYLENOL) suppository 650 mg  650 mg Rectal Q6H PRN 11/29/19, MD       albuterol (VENTOLIN HFA) 108 (90 Base) MCG/ACT inhaler 2 puff  2 puff Inhalation Q6H PRN Venora Maples, MD       ALPRAZolam Venora Maples) tablet 0.25 mg  0.25 mg Oral Daily PRN Prudy Feeler, MD   0.25 mg at 11/29/19 0950   amLODipine (NORVASC) tablet 10 mg  10 mg Oral Daily 12/01/19, MD   10 mg at 11/29/19 0950   aspirin EC tablet 81 mg  81 mg Oral Daily 12/01/19, MD   81 mg at 11/29/19 0949   benazepril (LOTENSIN) tablet 10 mg  10 mg Oral Daily 12/01/19, MD   10 mg at 11/29/19 0950   ceFAZolin (ANCEF) IVPB 1 g/50 mL premix  1 g Intravenous Q8H Hallaji, Sheema M, RPH 100 mL/hr at 11/29/19 0507 1 g at 11/29/19 0507   cholecalciferol (VITAMIN  D) tablet 1,000 Units  1,000 Units Oral Daily Venora Maples, MD   1,000 Units at 11/29/19 4132   collagenase (SANTYL) ointment   Topical Daily Lurene Shadow, MD   Given at 11/28/19 1219   doxycycline (VIBRA-TABS) tablet 100 mg  100 mg Oral Q12H Marrion Coy, MD   100 mg at 11/29/19 0950   enoxaparin (LOVENOX) injection 40 mg  40 mg Subcutaneous Q24H Venora Maples, MD   40 mg at 11/28/19 2257   galantamine (RAZADYNE ER) 24 hr capsule 8 mg  8 mg Oral Q breakfast Venora Maples, MD   8 mg at 11/29/19 0950   ondansetron (ZOFRAN) tablet 4 mg  4 mg Oral Q6H PRN Venora Maples, MD       Or   ondansetron [facility]) injection 4 mg  4 mg Intravenous Q6H PRN Venora Maples,  MD       polyethylene glycol (MIRALAX / GLYCOLAX) packet 17 g  17 g Oral Daily PRN Venora Maples, MD       QUEtiapine (SEROQUEL) tablet 25 mg  25 mg Oral q AM Hallaji, Sheema M, RPH   25 mg at 11/29/19 1013   And   QUEtiapine (SEROQUEL) tablet 50 mg  50 mg Oral QHS Hallaji, Sheema M, RPH   50 mg at 11/28/19 2256   sodium chloride flush (NS) 0.9 % injection 3 mL  3 mL Intravenous Q12H Venora Maples, MD   3 mL at 11/29/19 1014   traZODone (DESYREL) tablet 50 mg  50 mg Oral QHS PRN Venora Maples, MD   50 mg at 11/27/19 0030   vitamin B-12 (CYANOCOBALAMIN) tablet 1,000 mcg  1,000 mcg Oral Daily Venora Maples, MD   1,000 mcg at 11/29/19 4401     Discharge Medications: Please see discharge summary for a list of discharge medications.  Relevant Imaging Results:  Relevant Lab Results:   Additional Information SS#: [id number]. [facility] at [facility] Active with [facility] for PT and RN. Son and daughter-in-law considering LTC after rehab. Would like to start Medicaid process. In the past her monthly income has been too high.  Margarito Liner, LCSW

## 2019-11-29 NOTE — TOC Progression Note (Addendum)
Transition of Care [facility]) - Progression Note    Patient Details  Name: Dominique Walton MRN: [id number] Date of Birth: 05-13-1926  [facility] CM/SW Contact  Margarito Liner, LCSW Phone Number: 11/29/2019, 10:07 AM  Clinical Narrative: Left voicemail for son Dorinda Hill. Will discuss SNF recommendation when he calls back.    12:35 pm: Received call back from son. CSW introduced role and explained that PT recommendations would be discussed. Patient's son initially worried about SNF placement due to patient's dementia. He was hoping she could return to [facility] with home health. Son called back again and had wife on three-way call. CSW explained PT recommendations to wife. They are now willing to consider SNF placement and are also consider long-term care after rehab if possible. Patient does not have Medicaid and her monthly income has prevented her from qualifying in the past. CSW will follow up with the once bed offers are available. [facility] most [facility] their top preference due to CMS star rating and because they live in [place].  4:07 pm: Currently, patient has one bed offer: [facility]. CSW notified son and daughter-in-law. [facility] and [facility] are the only two that are still pending. Daughter-in-law would like to research these and will follow up tomorrow. Patient has had both of her COVID vaccines.  Expected Discharge Plan: Memory Care Barriers to Discharge: Continued Medical Work up  Expected Discharge Plan and Services Expected Discharge Plan: Memory Care     Post Acute Care Choice: Resumption of Svcs/PTA Provider Living arrangements for the past 2 months: Assisted Living Facility (Memory Care)                                       Social Determinants of Health (SDOH) Interventions    Readmission Risk Interventions No flowsheet data found.

## 2019-11-30 ENCOUNTER — Inpatient Hospital Stay
Admit: 2019-11-30 | Discharge: 2019-11-30 | Disposition: A | Discharge status: Home / Self Care | Payer: Medicare PPO | Attending: Internal Medicine | Admitting: Internal Medicine

## 2019-11-30 ENCOUNTER — Inpatient Hospital Stay: Payer: Medicare PPO

## 2019-11-30 DIAGNOSIS — J9601 Acute respiratory failure with hypoxia: Secondary | ICD-10-CM

## 2019-11-30 DIAGNOSIS — I5033 Acute on chronic diastolic (congestive) heart failure: Secondary | ICD-10-CM

## 2019-11-30 LAB — BASIC METABOLIC PANEL
Anion gap: 1 — ABNORMAL LOW (ref 5–15)
BUN: 25 mg/dL — ABNORMAL HIGH (ref 8–23)
CO2: 41 mmol/L — ABNORMAL HIGH (ref 22–32)
Calcium: 9.4 mg/dL (ref 8.9–10.3)
Chloride: 110 mmol/L (ref 98–111)
Creatinine, Ser: 0.83 mg/dL (ref 0.44–1.00)
GFR calc Af Amer: >60 mL/min (ref >60)
GFR calc non Af Amer: >60 mL/min (ref >60)
Glucose, Bld: 124 mg/dL — ABNORMAL HIGH (ref 70–99)
Potassium: 3.9 mmol/L (ref 3.5–5.1)
Sodium: 152 mmol/L — ABNORMAL HIGH (ref 135–145)

## 2019-11-30 LAB — PROCALCITONIN: Procalcitonin: 0.15 ng/mL

## 2019-11-30 LAB — CBC WITH DIFFERENTIAL/PLATELET
Abs Immature Granulocytes: 0.08 10*3/uL — ABNORMAL HIGH (ref 0.00–0.07)
Basophils Absolute: 0.1 10*3/uL (ref 0.0–0.1)
Basophils Relative: 1 %
Eosinophils Absolute: 0.3 10*3/uL (ref 0.0–0.5)
Eosinophils Relative: 4 %
HCT: 30.6 % — ABNORMAL LOW (ref 36.0–46.0)
Hemoglobin: 9.3 g/dL — ABNORMAL LOW (ref 12.0–15.0)
Immature Granulocytes: 1 %
Lymphocytes Relative: 9 %
Lymphs Abs: 0.8 10*3/uL (ref 0.7–4.0)
MCH: 32.5 pg (ref 26.0–34.0)
MCHC: 30.4 g/dL (ref 30.0–36.0)
MCV: 107 fL — ABNORMAL HIGH (ref 80.0–100.0)
Monocytes Absolute: 0.8 10*3/uL (ref 0.1–1.0)
Monocytes Relative: 9 %
Neutro Abs: 6.2 10*3/uL (ref 1.7–7.7)
Neutrophils Relative %: 76 %
Platelets: 252 10*3/uL (ref 150–400)
RBC: 2.86 MIL/uL — ABNORMAL LOW (ref 3.87–5.11)
RDW: 14.6 % (ref 11.5–15.5)
WBC: 8.2 10*3/uL (ref 4.0–10.5)
nRBC: 0 % (ref 0.0–0.2)

## 2019-11-30 LAB — MAGNESIUM: Magnesium: 2.5 mg/dL — ABNORMAL HIGH (ref 1.7–2.4)

## 2019-11-30 MED ORDER — FUROSEMIDE 10 MG/ML IJ SOLN: 20.0000 mg | Freq: Once | INTRAMUSCULAR | Status: DC

## 2019-11-30 MED ORDER — HALOPERIDOL LACTATE 5 MG/ML IJ SOLN
5.0000 mg | Freq: Four times a day (QID) | INTRAMUSCULAR | Status: DC | PRN
  Administered 2019-12-02: 5 mg via INTRAVENOUS
  Filled 2019-11-30: qty 1

## 2019-11-30 MED ORDER — LORAZEPAM 2 MG/ML IJ SOLN
INTRAMUSCULAR | Status: AC
  Administered 2019-11-30: 2 mg
  Filled 2019-11-30: qty 1

## 2019-11-30 MED ORDER — METOLAZONE 5 MG PO TABS
5.0000 mg | ORAL_TABLET | Freq: Every day | ORAL | Status: DC
  Administered 2019-11-30 – 2019-12-04 (×5): 5 mg via ORAL
  Filled 2019-11-30 (×5): qty 1

## 2019-11-30 MED ORDER — AMOXICILLIN-POT CLAVULANATE 500-125 MG PO TABS
1.0000 | ORAL_TABLET | Freq: Two times a day (BID) | ORAL | Status: DC
  Administered 2019-11-30 – 2019-12-04 (×9): 500 mg via ORAL
  Filled 2019-11-30 (×10): qty 1

## 2019-11-30 MED ORDER — FUROSEMIDE 10 MG/ML IJ SOLN
20.0000 mg | Freq: Once | INTRAMUSCULAR | Status: AC
  Administered 2019-11-30: 20 mg via INTRAVENOUS
  Filled 2019-11-30: qty 4

## 2019-11-30 NOTE — Consult Note (Signed)
WOC Nurse wound follow up Requested to re-assess left leg.  Consult performed on 8/24 and Santyl was ordered at that time to provide enzymatic debridement.  Eschar is becoming looser and beginning to lift at edges to anterior and posterior calf full thickness wounds.  Mod amt tan drainage, painful to touch.  Continue present plan of care. Please re-consult if further assistance is needed.  Thank-you,  Cammie Mcgee MSN, RN, CWOCN, [place], CNS [phone]

## 2019-11-30 NOTE — Progress Notes (Signed)
*  PRELIMINARY RESULTS* Echocardiogram 2D Echocardiogram has been performed.  Cristela Blue 11/30/2019, 2:39 PM

## 2019-11-30 NOTE — Progress Notes (Signed)
Pharmacy Antibiotic Note  Dominique Walton is a 86 y.o. female admitted on 11/26/2019 with left lower extremity cellulitis.  Pharmacy has been consulted for cefazolin dosing.  Day 5 IV antibiotics; WBC WNL and patient remains afebrile. Patient's renal function remains stable.  8/25 PO doxycycline added to cover MRSA d/t left leg cellulitis not improving.   Plan: Will continue cefazolin 1g IV every 8 hours  -MD planning to switch to oral abx at time of discharge  Height: 5\' 3"  (160 cm) Weight: 61.6 kg (135 lb 12.8 oz) IBW/kg (Calculated) : 52.4  Temp (24hrs), Avg:98.6 F (37 C), Min:98.3 F (36.8 C), Max:99.1 F (37.3 C)  Recent Labs  Lab 11/26/19 0517 11/27/19 0607 11/29/19 0536 11/30/19 0740  WBC 7.9 9.2 9.9 8.2  CREATININE 0.88 0.93 0.64 0.83  LATICACIDVEN 0.8  --   --   --     Estimated Creatinine Clearance: 36.5 mL/min (by C-G formula based on SCr of 0.83 mg/dL).    No Known Allergies  Antimicrobials this admission: 8/23 Ceftriaxone x1 8/23 cefazolin>> 8/25 doxycycline>>  Microbiology results: 8/23 BCx: NGTD 8/23 UCx: multiple species, suggest recollect  Thank you for allowing pharmacy to be a part of this patient's care.  9/23, PharmD Pharmacy Resident  11/30/2019 1:03 PM

## 2019-11-30 NOTE — Progress Notes (Signed)
PROGRESS NOTE    SELISA TENSLEY  FHL:[id number] DOB: 02-May-1926 DOA: 11/26/2019 PCP: Rusty Aus, MD    Chief complaint.  Hypoxemia.  Brief Narrative:  Dominique Walton a 86 y.o.femalewith medical history significant for dementia, hypertension, anxiety, was brought to the hospital because of a fall while the assisted living facility. Apparently, she is on the memory care unit of the facility. She was found to have left leg cellulitis. She was treated with IV Ancef and analgesics. Wound care nurse was consulted for wounds on the left leg.  8/25.Left legcellulitis not improving, added doxycycline to cover MRSA. Talk with patient son, patient's DO NOT RESUSCITATE status.  8/26.  Patient has increased oxygen requirement to 5 L today.  Obtain chest x-ray.  Patient also has significant murmur suggesting aortic stenosis, obtain echocardiogram.  Increased crackles today, give 20 mg Lasix in addition to metolazone 5 mg.  Assessment & Plan:   Active Problems:   Cellulitis of left leg   Alzheimer's dementia ([place])   Essential hypertension  #1.  Acute hypoxemic respiratory failure. Patient developed significant hypoxemia today.  Athogh she did not complain any short of breath.  She has some volume overload based on exam.  She probably has acute on chronic congestive heart failure with baseline valvular disease.  She will be given 20 mg IV Lasix, additionally she is giving 5 mg metolazone due to significant hyponatremia.  #2. Acute on chronic diastolic congestive heart failure most likely due to aortic stenosis. Checked outside medical record, did not find echocardiogram.  Will obtain echocardiogram.  Given metolazone.  #3. Left leg cellulitis. Condition had improved.  Change antibiotic to oral Augmentin and doxycycline.  4.  Hypernatremia. Secondary to poor p.o. intake.  Now patient has worsening hypoxemia, giving diuretics with metolazone.  May give D5 water if volume status is  better.  5.  Alzheimer's dementia.  6.  Goals of care discussion. Patient appeared to have worsening hypoxemia today, she has some volume overload.  Based on my examination, she has valvular disease potentially aortic stenosis.  Even her cellulitis getting better, she developed some congestive heart failure.  I'll obtain echocardiogram to confirm that.  She'll be given diuretics, but if for her oxygenation does not improve, she probably can be referred to hospice.  Discussed with the son and the daughter-in-law, they're agreeable.  Patient long-term prognosis is very poor.   DVT prophylaxis:Lovenox  Code Status:DNR Family Communication:Discussed with patient son and daugher-in-law, all questions answered.  Disposition Plan:  Patient came from:ALF  Anticipated d/c place:?  Barriers to d/c OR conditions which need to be met to effect a safe d/c:   Consultants:  None  Procedures:None Antimicrobials: Cefazolin and doxycycline   Subjective: Developed hypoxemia,  she does not feel any short of breath.  She has not been eating much.  But no nausea vomiting.  She did not have any fever or chills.  Objective: Vitals:   11/29/19 1645 11/29/19 2039 11/30/19 0740 11/30/19 0744  BP: 131/80 138/77 (!) 148/120 (!) 147/64  Pulse: 95 86 89 98  Resp: 20 20 18    Temp: 98.3 F (36.8 C) 98.4 F (36.9 C) 99.1 F (37.3 C)   TempSrc: Oral Oral Oral   SpO2: 92%  91%   Weight:      Height:        Intake/Output Summary (Last 24 hours) at 11/30/2019 1334 Last data filed at 11/30/2019 0600 Gross per 24 hour  Intake --  Output 650  ml  Net -650 ml   Filed Weights   11/26/19 1105  Weight: 61.6 kg    Examination:  General exam: Appears calm and comfortable  Respiratory system: A few crackles in the base to auscultation. Respiratory effort  normal. Cardiovascular system: S1 & S2 heard, RRR.  She has a 2/6 SM at RUSB and apex. 1+ leg edema Gastrointestinal system: Abdomen is nondistended, soft and nontender. No organomegaly or masses felt. Normal bowel sounds heard. Central nervous system: Alert and oriented x1. No focal neurological deficits. Extremities: Symmetric  Skin: No rashes, lesions or ulcers     Data Reviewed: I have personally reviewed following labs and imaging studies  CBC: Recent Labs  Lab 11/26/19 0517 11/27/19 0607 11/29/19 0536 11/30/19 0740  WBC 7.9 9.2 9.9 8.2  NEUTROABS 6.1  --  8.1* 6.2  HGB 10.6* 8.3* 9.0* 9.3*  HCT 31.4* 26.0* 28.1* 30.6*  MCV 98.7 102.4* 102.2* 107.0*  PLT 224 228 221 824   Basic Metabolic Panel: Recent Labs  Lab 11/26/19 0517 11/27/19 0607 11/29/19 0536 11/30/19 0740  NA 140 144 151* 152*  K 4.2 3.8 3.8 3.9  CL 104 104 109 110  CO2 24 26 32 41*  GLUCOSE 100* 115* 120* 124*  BUN 41* 32* 28* 25*  CREATININE 0.88 0.93 0.64 0.83  CALCIUM 9.0 8.5* 8.7* 9.4  MG  --   --  2.5* 2.5*   GFR: Estimated Creatinine Clearance: 36.5 mL/min (by C-G formula based on SCr of 0.83 mg/dL). Liver Function Tests: Recent Labs  Lab 11/26/19 0517  AST 29  ALT 22  ALKPHOS 59  BILITOT 1.0  PROT 7.7  ALBUMIN 4.0   No results for input(s): LIPASE, AMYLASE in the last 168 hours. No results for input(s): AMMONIA in the last 168 hours. Coagulation Profile: Recent Labs  Lab 11/26/19 0517  INR 0.9   Cardiac Enzymes: No results for input(s): CKTOTAL, CKMB, CKMBINDEX, TROPONINI in the last 168 hours. BNP (last 3 results) No results for input(s): PROBNP in the last 8760 hours. HbA1C: No results for input(s): HGBA1C in the last 72 hours. CBG: No results for input(s): GLUCAP in the last 168 hours. Lipid Profile: No results for input(s): CHOL, HDL, LDLCALC, TRIG, CHOLHDL, LDLDIRECT in the last 72 hours. Thyroid Function Tests: No results for input(s): TSH, T4TOTAL, FREET4,  T3FREE, THYROIDAB in the last 72 hours. Anemia Panel: Recent Labs    11/28/19 1309  VITAMINB12 3,645*  TIBC 259  IRON 14*   Sepsis Labs: Recent Labs  Lab 11/26/19 0517 11/29/19 0536  PROCALCITON  --  0.13  LATICACIDVEN 0.8  --     Recent Results (from the past 240 hour(s))  Urine culture     Status: Abnormal   Collection Time: 11/26/19  5:16 AM   Specimen: Urine, Random  Result Value Ref Range Status   Specimen Description   Final    URINE, RANDOM Performed at [facility], [address] [place], [place] [address]    Special Requests   Final    NONE Performed at [facility], [address] [place], [place] [address]    Culture (A)  Final    30,000 COLONIES/mL MULTIPLE SPECIES PRESENT, SUGGEST RECOLLECTION   Report Status 11/27/2019 FINAL  Final  SARS Coronavirus 2 by RT PCR (hospital order, performed in [place] hospital lab) Nasopharyngeal Nasopharyngeal Swab     Status: None   Collection Time: 11/26/19  5:16 AM   Specimen: Nasopharyngeal Swab  Result  Value Ref Range Status   SARS Coronavirus 2 NEGATIVE NEGATIVE Final    Comment: (NOTE) SARS-CoV-2 target nucleic acids are NOT DETECTED.  The SARS-CoV-2 RNA is generally detectable in upper and lower respiratory specimens during the acute phase of infection. The lowest concentration of SARS-CoV-2 viral copies this assay can detect is 250 copies / mL. A negative result does not preclude SARS-CoV-2 infection and should not be used as the sole basis for treatment or other patient management decisions.  A negative result may occur with improper specimen collection / handling, submission of specimen other than nasopharyngeal swab, presence of viral mutation(s) within the areas targeted by this assay, and inadequate number of viral copies (<250 copies / mL). A negative result must be combined with clinical observations, patient history, and epidemiological information.  Fact Sheet for Patients:    StrictlyIdeas.no  Fact Sheet for Healthcare Providers: BankingDealers.co.za  This test is not yet approved or  cleared by the [place] FDA and has been authorized for detection and/or diagnosis of SARS-CoV-2 by FDA under an Emergency Use Authorization (EUA).  This EUA will remain in effect (meaning this test can be used) for the duration of the COVID-19 declaration under Section 564(b)(1) of the Act, 21 U.S.C. section 360bbb-3(b)(1), unless the authorization is terminated or revoked sooner.  Performed at [facility], [address] [place], [place] [address]   Blood culture (routine single)     Status: None (Preliminary result)   Collection Time: 11/26/19  5:17 AM   Specimen: BLOOD  Result Value Ref Range Status   Specimen Description BLOOD LEFT FA  Final   Special Requests   Final    BOTTLES DRAWN AEROBIC AND ANAEROBIC Blood Culture results may not be optimal due to an excessive volume of blood received in culture bottles   Culture   Final    NO GROWTH 4 DAYS Performed at [facility], [address], [place], [place] [address]    Report Status PENDING  Incomplete  MRSA PCR Screening     Status: None   Collection Time: 11/29/19  4:50 PM   Specimen: Nasal Mucosa; Nasopharyngeal  Result Value Ref Range Status   MRSA by PCR NEGATIVE NEGATIVE Final    Comment:        The GeneXpert MRSA Assay (FDA approved for NASAL specimens only), is one component of a comprehensive MRSA colonization surveillance program. It is not intended to diagnose MRSA infection nor to guide or monitor treatment for MRSA infections. Performed at [facility], [address], [place], [place] [address]          Radiology Studies: No results found.      Scheduled Meds: . amLODipine  10 mg Oral Daily  . aspirin EC  81 mg Oral Daily  . benazepril  10 mg Oral Daily  . cholecalciferol  1,000 Units Oral  Daily  . collagenase   Topical Daily  . doxycycline  100 mg Oral Q12H  . enoxaparin (LOVENOX) injection  40 mg Subcutaneous Q24H  . galantamine  8 mg Oral Q breakfast  . LORazepam  2 mg Intramuscular Once  . metolazone  5 mg Oral Daily  . QUEtiapine  25 mg Oral q AM   And  . QUEtiapine  50 mg Oral QHS  . sodium chloride flush  3 mL Intravenous Q12H  . vitamin B-12  1,000 mcg Oral Daily   Continuous Infusions: . sodium chloride Stopped (11/28/19 2353)  .  ceFAZolin (ANCEF)  IV 1 g (11/30/19 0653)     LOS: 4 days    Time spent: 36 minutes    Sharen Hones, MD [facility]   To contact the attending provider between 7A-7P or the covering provider during after hours 7P-7A, please log into the web site [email or web address] and access using universal [place] password for that web site. If you do not have the password, please call the hospital operator.  11/30/2019, 1:34 PM

## 2019-11-30 NOTE — TOC Progression Note (Addendum)
Transition of Care [facility]) - Progression Note    Patient Details  Name: SHANAIA SIEVERS MRN: [id number] Date of Birth: 11/22/1926  [facility] CM/SW Contact  Margarito Liner, LCSW Phone Number: 11/30/2019, 12:17 PM  Clinical Narrative:  Uploaded clinicals into [facility] for insurance authorization review for [facility].   1:43 pm: Insurance authorization approved: [id number]. Per MD, patient is not stable for discharge today. [facility] aware to plan for weekend discharge.   Expected Discharge Plan: Skilled Nursing Facility Barriers to Discharge: English as a second language teacher, SNF Pending bed offer  Expected Discharge Plan and Services Expected Discharge Plan: Skilled Nursing Facility     Post Acute Care Choice: Skilled Nursing Facility, Resumption of Svcs/PTA Provider Living arrangements for the past 2 months: Assisted Living Facility (Memory Care)                                       Social Determinants of Health (SDOH) Interventions    Readmission Risk Interventions No flowsheet data found.

## 2019-12-01 DIAGNOSIS — I350 Nonrheumatic aortic (valve) stenosis: Secondary | ICD-10-CM

## 2019-12-01 DIAGNOSIS — I5033 Acute on chronic diastolic (congestive) heart failure: Secondary | ICD-10-CM

## 2019-12-01 DIAGNOSIS — E870 Hyperosmolality and hypernatremia: Secondary | ICD-10-CM

## 2019-12-01 LAB — BASIC METABOLIC PANEL
Anion gap: 8 (ref 5–15)
BUN: 26 mg/dL — ABNORMAL HIGH (ref 8–23)
CO2: 39 mmol/L — ABNORMAL HIGH (ref 22–32)
Calcium: 9 mg/dL (ref 8.9–10.3)
Chloride: 104 mmol/L (ref 98–111)
Creatinine, Ser: 0.75 mg/dL (ref 0.44–1.00)
GFR calc Af Amer: >60 mL/min (ref >60)
GFR calc non Af Amer: >60 mL/min (ref >60)
Glucose, Bld: 125 mg/dL — ABNORMAL HIGH (ref 70–99)
Potassium: 3.3 mmol/L — ABNORMAL LOW (ref 3.5–5.1)
Sodium: 151 mmol/L — ABNORMAL HIGH (ref 135–145)

## 2019-12-01 LAB — ECHOCARDIOGRAM COMPLETE
AR max vel: 0.85 cm2
AV Area VTI: 0.89 cm2
AV Area mean vel: 0.71 cm2
AV Mean grad: 36.3 mmHg
AV Peak grad: 62 mmHg
Ao pk vel: 3.94 m/s
Area-P 1/2: 3.6 cm2
Height: 63 in
S' Lateral: 1.79 cm
Weight: 2172.8 oz

## 2019-12-01 LAB — CULTURE, BLOOD (SINGLE): Culture: NO GROWTH

## 2019-12-01 MED ORDER — FUROSEMIDE 10 MG/ML IJ SOLN
40.0000 mg | Freq: Once | INTRAMUSCULAR | Status: AC
  Administered 2019-12-01: 40 mg via INTRAVENOUS
  Filled 2019-12-01: qty 4

## 2019-12-01 MED ORDER — POTASSIUM CL IN DEXTROSE 5% 20 MEQ/L IV SOLN
20.0000 meq | INTRAVENOUS | Status: AC
  Filled 2019-12-01: qty 1000

## 2019-12-01 MED ORDER — POTASSIUM CHLORIDE 10 MEQ/100ML IV SOLN
10.0000 meq | INTRAVENOUS | Status: AC
  Administered 2019-12-01 (×2): 10 meq via INTRAVENOUS
  Filled 2019-12-01 (×2): qty 100

## 2019-12-01 MED ORDER — FUROSEMIDE 10 MG/ML IJ SOLN: 40.0000 mg | Freq: Two times a day (BID) | INTRAMUSCULAR | Status: DC

## 2019-12-01 MED ORDER — QUETIAPINE FUMARATE 25 MG PO TABS
25.0000 mg | ORAL_TABLET | Freq: Every day | ORAL | Status: DC
  Administered 2019-12-01 – 2019-12-03 (×3): 25 mg via ORAL
  Filled 2019-12-01 (×3): qty 1

## 2019-12-01 NOTE — Progress Notes (Signed)
PROGRESS NOTE    Dominique Walton  WHQ:[id number] DOB: [birth year] DOA: 11/26/2019 PCP: Rusty Aus, MD   Chief complaint.  Hypoxia Brief Narrative:  Dominique Walton a 86 y.o.femalewith medical history significant for dementia, hypertension, anxiety, was brought to the hospital because of a fall while the assisted living facility. Apparently, she is on the memory care unit of the facility. She was found to have left leg cellulitis. She was treated with IV Ancef and analgesics. Wound care nurse was consulted for wounds on the left leg.  8/25.Left legcellulitis not improving, added doxycycline to cover MRSA. Talk with patient son, patient's DO NOT RESUSCITATE status.  8/27.  Patient has increased oxygen requirement to 5 L today.  Obtain chest x-ray.  Patient also has significant murmur suggesting aortic stenosis, obtain echocardiogram.  Increased crackles today, give 20 mg Lasix in addition to metolazone 5 mg.  8/28.  Echocardiogram showed ejection fraction normal, moderate to severe aortic stenosis.  Still has significant volume overload, given 40 mg IV Lasix in addition to 5 mg metolazone daily.  Give 500 mL D5 water for hypernatremia.    Assessment & Plan:   Active Problems:   Cellulitis of left leg   Alzheimer's dementia ([place])   Essential hypertension  #1.  Acute hypoxemic respiratory failure. Appear to be secondary to acute on chronic diastolic congestive heart failure and aortic stenosis. Patient still has some volume overload today, will give 40 mg IV Lasix and a 5 mg metolazone.  2.  Acute on chronic diastolic congestive heart failure with moderate to severe aortic stenosis. As above.  3.  Hypernatremia. We will give 500 mm of D5 water.  4.  Hypokalemia. Supplemented  5.  Left leg cellulitis. Condition much improved.  Continue oral Augmentin and doxycycline.  6.  Alzheimer dementia.  Discussed with patient son again, will continue treat congestive heart  failure.  Planning to send her to nursing home for rehab.  But if her condition deteriorates, she can be a candidate for hospice.  DVT prophylaxis:Lovenox  Code Status:DNR Family Communication:Discussed with patient son, all questions answered.  Disposition Plan:  Patient came from:ALF  Anticipated d/c place:?  Barriers to d/c OR conditions which need to be met to effect a safe d/c:   Consultants:  None  Procedures:None Antimicrobials: Augmentin and doxycycline    Subjective: Patient still very confused.  She has a very poor appetite.  She had increased oxygen requirement yesterday, currently on 3 L oxygen.  No cough. No fever chills.  No nausea vomiting.  Objective: Vitals:   12/01/19 0404 12/01/19 0452 12/01/19 0454 12/01/19 0455  BP:  (!) 136/91    Pulse:  78    Resp:  17    Temp:  97.8 F (36.6 C)    TempSrc:  Oral    SpO2: 93% (!) 88% 92% 92%  Weight:      Height:        Intake/Output Summary (Last 24 hours) at 12/01/2019 1026 Last data filed at 11/30/2019 2200 Gross per 24 hour  Intake 40 ml  Output 1150 ml  Net -1110 ml   Filed Weights   11/26/19 1105  Weight: 61.6 kg    Examination:  General exam: Appears calm and comfortable, ill-appearing. Respiratory system: Fine crackles in the base. Respiratory effort normal. Cardiovascular system: S1 & S2 heard, RRR. No JVD, murmurs, rubs, gallops or clicks. 1+ pedal edema. Gastrointestinal system: Abdomen is nondistended, soft and nontender. No organomegaly or masses felt.  Normal bowel sounds heard. Central nervous system: Alert and oriented x1. No focal neurological deficits. Extremities: Symmetric  Skin: No rashes, lesions or ulcers Psychiatry:  Mood & affect appropriate.     Data Reviewed: I have personally reviewed following labs and imaging  studies  CBC: Recent Labs  Lab 11/26/19 0517 11/27/19 0607 11/29/19 0536 11/30/19 0740  WBC 7.9 9.2 9.9 8.2  NEUTROABS 6.1  --  8.1* 6.2  HGB 10.6* 8.3* 9.0* 9.3*  HCT 31.4* 26.0* 28.1* 30.6*  MCV 98.7 102.4* 102.2* 107.0*  PLT 224 228 221 892   Basic Metabolic Panel: Recent Labs  Lab 11/26/19 0517 11/27/19 0607 11/29/19 0536 11/30/19 0740 12/01/19 0557  NA 140 144 151* 152* 151*  K 4.2 3.8 3.8 3.9 3.3*  CL 104 104 109 110 104  CO2 24 26 32 41* 39*  GLUCOSE 100* 115* 120* 124* 125*  BUN 41* 32* 28* 25* 26*  CREATININE 0.88 0.93 0.64 0.83 0.75  CALCIUM 9.0 8.5* 8.7* 9.4 9.0  MG  --   --  2.5* 2.5*  --    GFR: Estimated Creatinine Clearance: 37.9 mL/min (by C-G formula based on SCr of 0.75 mg/dL). Liver Function Tests: Recent Labs  Lab 11/26/19 0517  AST 29  ALT 22  ALKPHOS 59  BILITOT 1.0  PROT 7.7  ALBUMIN 4.0   No results for input(s): LIPASE, AMYLASE in the last 168 hours. No results for input(s): AMMONIA in the last 168 hours. Coagulation Profile: Recent Labs  Lab 11/26/19 0517  INR 0.9   Cardiac Enzymes: No results for input(s): CKTOTAL, CKMB, CKMBINDEX, TROPONINI in the last 168 hours. BNP (last 3 results) No results for input(s): PROBNP in the last 8760 hours. HbA1C: No results for input(s): HGBA1C in the last 72 hours. CBG: No results for input(s): GLUCAP in the last 168 hours. Lipid Profile: No results for input(s): CHOL, HDL, LDLCALC, TRIG, CHOLHDL, LDLDIRECT in the last 72 hours. Thyroid Function Tests: No results for input(s): TSH, T4TOTAL, FREET4, T3FREE, THYROIDAB in the last 72 hours. Anemia Panel: Recent Labs    11/28/19 1309  VITAMINB12 3,645*  TIBC 259  IRON 14*   Sepsis Labs: Recent Labs  Lab 11/26/19 0517 11/29/19 0536 11/30/19 0740  PROCALCITON  --  0.13 0.15  LATICACIDVEN 0.8  --   --     Recent Results (from the past 240 hour(s))  Urine culture     Status: Abnormal   Collection Time: 11/26/19  5:16 AM    Specimen: Urine, Random  Result Value Ref Range Status   Specimen Description   Final    URINE, RANDOM Performed at [facility], [address], [place], [place] [address]    Special Requests   Final    NONE Performed at [facility], [address] [place], [place] [address]    Culture (A)  Final    30,000 COLONIES/mL MULTIPLE SPECIES PRESENT, SUGGEST RECOLLECTION   Report Status 11/27/2019 FINAL  Final  SARS Coronavirus 2 by RT PCR (hospital order, performed in [place] hospital lab) Nasopharyngeal Nasopharyngeal Swab     Status: None   Collection Time: 11/26/19  5:16 AM   Specimen: Nasopharyngeal Swab  Result Value Ref Range Status   SARS Coronavirus 2 NEGATIVE NEGATIVE Final    Comment: (NOTE) SARS-CoV-2 target nucleic acids are NOT DETECTED.  The SARS-CoV-2 RNA is generally detectable in upper and lower respiratory specimens during the acute phase of infection. The lowest concentration of SARS-CoV-2 viral copies  this assay can detect is 250 copies / mL. A negative result does not preclude SARS-CoV-2 infection and should not be used as the sole basis for treatment or other patient management decisions.  A negative result may occur with improper specimen collection / handling, submission of specimen other than nasopharyngeal swab, presence of viral mutation(s) within the areas targeted by this assay, and inadequate number of viral copies (<250 copies / mL). A negative result must be combined with clinical observations, patient history, and epidemiological information.  Fact Sheet for Patients:   StrictlyIdeas.no  Fact Sheet for Healthcare Providers: BankingDealers.co.za  This test is not yet approved or  cleared by the [place] FDA and has been authorized for detection and/or diagnosis of SARS-CoV-2 by FDA under an Emergency Use Authorization (EUA).  This EUA will remain in effect (meaning this  test can be used) for the duration of the COVID-19 declaration under Section 564(b)(1) of the Act, 21 U.S.C. section 360bbb-3(b)(1), unless the authorization is terminated or revoked sooner.  Performed at [facility], [address] [place], [place] [address]   Blood culture (routine single)     Status: None (Preliminary result)   Collection Time: 11/26/19  5:17 AM   Specimen: BLOOD  Result Value Ref Range Status   Specimen Description BLOOD LEFT FA  Final   Special Requests   Final    BOTTLES DRAWN AEROBIC AND ANAEROBIC Blood Culture results may not be optimal due to an excessive volume of blood received in culture bottles   Culture   Final    NO GROWTH 4 DAYS Performed at [facility], [address] [place], [place] [address]    Report Status PENDING  Incomplete  MRSA PCR Screening     Status: None   Collection Time: 11/29/19  4:50 PM   Specimen: Nasal Mucosa; Nasopharyngeal  Result Value Ref Range Status   MRSA by PCR NEGATIVE NEGATIVE Final    Comment:        The GeneXpert MRSA Assay (FDA approved for NASAL specimens only), is one component of a comprehensive MRSA colonization surveillance program. It is not intended to diagnose MRSA infection nor to guide or monitor treatment for MRSA infections. Performed at [facility], [address] [place], [place] [address]          Radiology Studies: DG Chest 1 View  Result Date: 11/30/2019 CLINICAL DATA:  Acute hypoxemic respiratory failure. EXAM: CHEST  1 VIEW COMPARISON:  None. FINDINGS: Normal cardiac silhouette. There are new bilateral patchy airspace disease. Lungs are hyperinflated. Probable bilateral pleural effusions. No pneumothorax. Aggressive osseous lesion. IMPRESSION: Findings suggest flash pulmonary edema and effusions. Multifocal pneumonia could have similar pattern. Electronically Signed   By: Suzy Bouchard M.D.   On: 11/30/2019 14:49   ECHOCARDIOGRAM  COMPLETE  Result Date: 12/01/2019    ECHOCARDIOGRAM REPORT   Patient Name:   Dominique Walton Date of Exam: 11/30/2019 Medical Rec #:  [id number]    Height:       63.0 in Accession #:    [id number]   Weight:       135.8 lb Date of Birth:  June 22, 1926    BSA:          1.640 m Patient Age:    28 years     BP:           147/64 mmHg Patient Gender: F            HR:  98 bpm. Exam Location:  [facility] Procedure: 2D Echo, Cardiac Doppler and Color Doppler Indications:     Aortic stenosis 424.1  History:         Patient has no prior history of Echocardiogram examinations.                  Risk Factors:Hypertension. Dementia.  Sonographer:     Sherrie Sport RDCS (AE) Referring Phys:  [id number] Sharen Hones Diagnosing Phys: Serafina Royals MD IMPRESSIONS  1. Left ventricular ejection fraction, by estimation, is 65 to 70%. The left ventricle has normal function. The left ventricle has no regional wall motion abnormalities. Left ventricular diastolic parameters were normal.  2. Right ventricular systolic function is normal. The right ventricular size is normal. There is moderately elevated pulmonary artery systolic pressure.  3. Left atrial size was mildly dilated.  4. The mitral valve is normal in structure. Mild mitral valve regurgitation.  5. The aortic valve is normal in structure. Aortic valve regurgitation is trivial. Moderate to severe aortic valve stenosis. FINDINGS  Left Ventricle: Left ventricular ejection fraction, by estimation, is 65 to 70%. The left ventricle has normal function. The left ventricle has no regional wall motion abnormalities. The left ventricular internal cavity size was small. There is borderline left ventricular hypertrophy. Left ventricular diastolic parameters were normal. Right Ventricle: The right ventricular size is normal. No increase in right ventricular wall thickness. Right ventricular systolic function is normal. There is moderately elevated pulmonary artery systolic pressure. The tricuspid  regurgitant velocity is 3.09 m/s, and with an assumed right atrial pressure of 10 mmHg, the estimated right ventricular systolic pressure is 21.9 mmHg. Left Atrium: Left atrial size was mildly dilated. Right Atrium: Right atrial size was normal in size. Pericardium: A small pericardial effusion is present. Mitral Valve: The mitral valve is normal in structure. Mild mitral valve regurgitation. Tricuspid Valve: The tricuspid valve is normal in structure. Tricuspid valve regurgitation is mild. Aortic Valve: The aortic valve is normal in structure. Aortic valve regurgitation is trivial. Moderate to severe aortic stenosis is present. Aortic valve mean gradient measures 36.3 mmHg. Aortic valve peak gradient measures 62.0 mmHg. Aortic valve area, by VTI measures 0.89 cm. Pulmonic Valve: The pulmonic valve was normal in structure. Pulmonic valve regurgitation is not visualized. Aorta: The aortic root and ascending aorta are structurally normal, with no evidence of dilitation. IAS/Shunts: No atrial level shunt detected by color flow Doppler.  LEFT VENTRICLE PLAX 2D LVIDd:         4.04 cm  Diastology LVIDs:         1.79 cm  LV e' lateral:   6.42 cm/s LV PW:         1.21 cm  LV E/e' lateral: 17.1 LV IVS:        1.23 cm  LV e' medial:    5.77 cm/s LVOT diam:     2.00 cm  LV E/e' medial:  19.1 LV SV:         63 LV SV Index:   39 LVOT Area:     3.14 cm  RIGHT VENTRICLE RV Basal diam:  3.73 cm RV S prime:     16.40 cm/s TAPSE (M-mode): 3.0 cm LEFT ATRIUM         Index LA diam:    2.90 cm 1.77 cm/m  AORTIC VALVE                    PULMONIC VALVE AV Area (Vmax):  0.85 cm     PV Vmax:        1.00 m/s AV Area (Vmean):   0.71 cm     PV Peak grad:   4.0 mmHg AV Area (VTI):     0.89 cm     RVOT Peak grad: 9 mmHg AV Vmax:           393.67 cm/s AV Vmean:          281.667 cm/s AV VTI:            0.714 m AV Peak Grad:      62.0 mmHg AV Mean Grad:      36.3 mmHg LVOT Vmax:         107.00 cm/s LVOT Vmean:        63.800 cm/s LVOT VTI:           0.202 m LVOT/AV VTI ratio: 0.28  AORTA Ao Root diam: 2.80 cm MITRAL VALVE                TRICUSPID VALVE MV Area (PHT): 3.60 cm     TR Peak grad:   38.2 mmHg MV Decel Time: 211 msec     TR Vmax:        309.00 cm/s MV E velocity: 110.00 cm/s MV A velocity: 138.00 cm/s  SHUNTS MV E/A ratio:  0.80         Systemic VTI:  0.20 m                             Systemic Diam: 2.00 cm Serafina Royals MD Electronically signed by Serafina Royals MD Signature Date/Time: 12/01/2019/9:10:05 AM    Final         Scheduled Meds: . amLODipine  10 mg Oral Daily  . amoxicillin-clavulanate  1 tablet Oral BID  . aspirin EC  81 mg Oral Daily  . benazepril  10 mg Oral Daily  . cholecalciferol  1,000 Units Oral Daily  . collagenase   Topical Daily  . doxycycline  100 mg Oral Q12H  . enoxaparin (LOVENOX) injection  40 mg Subcutaneous Q24H  . furosemide  40 mg Intravenous Once  . galantamine  8 mg Oral Q breakfast  . LORazepam  2 mg Intramuscular Once  . metolazone  5 mg Oral Daily  . QUEtiapine  25 mg Oral QHS  . sodium chloride flush  3 mL Intravenous Q12H  . vitamin B-12  1,000 mcg Oral Daily   Continuous Infusions: . sodium chloride Stopped (11/28/19 2353)  . dextrose 5 % with KCl 20 mEq / L    . potassium chloride       LOS: 5 days    Time spent: 34 minutes    Sharen Hones, MD [facility]   To contact the attending provider between 7A-7P or the covering provider during after hours 7P-7A, please log into the web site [email or web address] and access using universal [place] password for that web site. If you do not have the password, please call the hospital operator.  12/01/2019, 10:26 AM

## 2019-12-02 DIAGNOSIS — R339 Retention of urine, unspecified: Secondary | ICD-10-CM

## 2019-12-02 LAB — BASIC METABOLIC PANEL
Anion gap: 15 (ref 5–15)
BUN: 31 mg/dL — ABNORMAL HIGH (ref 8–23)
CO2: 35 mmol/L — ABNORMAL HIGH (ref 22–32)
Calcium: 9.1 mg/dL (ref 8.9–10.3)
Chloride: 98 mmol/L (ref 98–111)
Creatinine, Ser: 0.82 mg/dL (ref 0.44–1.00)
GFR calc Af Amer: >60 mL/min (ref >60)
GFR calc non Af Amer: >60 mL/min (ref >60)
Glucose, Bld: 108 mg/dL — ABNORMAL HIGH (ref 70–99)
Potassium: 3 mmol/L — ABNORMAL LOW (ref 3.5–5.1)
Sodium: 148 mmol/L — ABNORMAL HIGH (ref 135–145)

## 2019-12-02 LAB — CBC WITH DIFFERENTIAL/PLATELET
Abs Immature Granulocytes: 0.07 10*3/uL (ref 0.00–0.07)
Basophils Absolute: 0.1 10*3/uL (ref 0.0–0.1)
Basophils Relative: 1 %
Eosinophils Absolute: 0.5 10*3/uL (ref 0.0–0.5)
Eosinophils Relative: 5 %
HCT: 32.4 % — ABNORMAL LOW (ref 36.0–46.0)
Hemoglobin: 10.1 g/dL — ABNORMAL LOW (ref 12.0–15.0)
Immature Granulocytes: 1 %
Lymphocytes Relative: 10 %
Lymphs Abs: 1 10*3/uL (ref 0.7–4.0)
MCH: 32.5 pg (ref 26.0–34.0)
MCHC: 31.2 g/dL (ref 30.0–36.0)
MCV: 104.2 fL — ABNORMAL HIGH (ref 80.0–100.0)
Monocytes Absolute: 0.9 10*3/uL (ref 0.1–1.0)
Monocytes Relative: 9 %
Neutro Abs: 7.7 10*3/uL (ref 1.7–7.7)
Neutrophils Relative %: 74 %
Platelets: 259 10*3/uL (ref 150–400)
RBC: 3.11 MIL/uL — ABNORMAL LOW (ref 3.87–5.11)
RDW: 14.2 % (ref 11.5–15.5)
WBC: 10.2 10*3/uL (ref 4.0–10.5)
nRBC: 0 % (ref 0.0–0.2)

## 2019-12-02 LAB — MAGNESIUM: Magnesium: 2.1 mg/dL (ref 1.7–2.4)

## 2019-12-02 MED ORDER — POTASSIUM CHLORIDE 20 MEQ PO PACK
40.0000 meq | PACK | ORAL | Status: AC
  Administered 2019-12-02 (×2): 40 meq via ORAL
  Filled 2019-12-02 (×2): qty 2

## 2019-12-02 MED ORDER — POTASSIUM CHLORIDE 10 MEQ/100ML IV SOLN
10.0000 meq | INTRAVENOUS | Status: DC
  Administered 2019-12-02 (×2): 10 meq via INTRAVENOUS
  Filled 2019-12-02 (×3): qty 100

## 2019-12-02 MED ORDER — HALOPERIDOL LACTATE 5 MG/ML IJ SOLN
5.0000 mg | Freq: Four times a day (QID) | INTRAMUSCULAR | Status: DC | PRN
  Administered 2019-12-02: 5 mg via INTRAMUSCULAR
  Filled 2019-12-02: qty 1

## 2019-12-02 MED ORDER — CHLORHEXIDINE GLUCONATE CLOTH 2 % EX PADS
6.0000 | MEDICATED_PAD | Freq: Every day | CUTANEOUS | Status: DC
  Administered 2019-12-02 – 2019-12-04 (×3): 6 via TOPICAL

## 2019-12-02 NOTE — Progress Notes (Signed)
PROGRESS NOTE    Dominique Walton  RCV:[id number] DOB: 1927/03/31 DOA: 11/26/2019 PCP: Rusty Aus, MD   Chief complaint.  Hypoxemia.  Brief Narrative:  Dominique Walton a 86 y.o.femalewith medical history significant for dementia, hypertension, anxiety, was brought to the hospital because of a fall while the assisted living facility. Apparently, she is on the memory care unit of the facility. She was found to have left leg cellulitis. She was treated with IV Ancef and analgesics. Wound care nurse was consulted for wounds on the left leg.  8/25.Left legcellulitis not improving, added doxycycline to cover MRSA. Talk with patient son, patient's DO NOT RESUSCITATE status.  8/27.Patient has increased oxygen requirement to 5 L today. Obtain chest x-ray. Patient also has significant murmur suggesting aortic stenosis, obtain echocardiogram. Increased crackles today, give 20 mg Lasix in addition to metolazone 5 mg.  8/28.  Echocardiogram showed ejection fraction normal, moderate to severe aortic stenosis.  Still has significant volume overload, given 40 mg IV Lasix in addition to 5 mg metolazone daily.  Give 500 mL D5 water for hypernatremia.  8/29.  Urinary retention over 1000 mL, RN failed to place a catheter, urology consulted for Foley placement.   Assessment & Plan:   Active Problems:   Cellulitis of left leg   Alzheimer's dementia ([place])   Essential hypertension   Aortic stenosis, severe   Acute on chronic diastolic CHF (congestive heart failure) (HCC)   Hypernatremia  #1.  Acute hypoxemic respiratory failure secondary to acute on chronic diastolic congestive heart failure. Condition improving on diuretics.  We will continue metolazone 5 mg daily.  2.  Acute on chronic diastolic congestive heart failure with a moderate to severe aortic stenosis. Continue diuretics with metolazone.  3.  Hypernatremia. This is due to poor p.o. intake.  Patient also has volume  overload.  Continue metolazone.  However, patient appetite may not improve.  Anticipating recurrent hypernatremia.  4.  Hypokalemia. Continue supplement per  5.  Left leg cellulitis. Condition much improved, continue Augmentin and doxycycline.  6.  Alzheimer dementia.  7. Urinary retention. Foley will be in for awhile.   8.  Adult failure to thrive.  Goal of care discussion. Discussed with patient and son and daughter-in-law.  Patient has significant medical conditions, not to be eating.  Now with newfound heart disease.  Family believes that patient should be receiving hospice care instead of aggressive medical care.  We will explore options for hospice care.    DVT prophylaxis:Lovenox  Code Status:DNR Family Communication:Discussed with patient son and daughter in law [phone]), all questions answered.  Disposition Plan:  Patient came from:ALF  Anticipated d/c place:SNF tomorrow.  Barriers to d/c OR conditions which need to be met to effect a safe d/c:   Consultants:  None  Procedures:None Antimicrobials: Augmentin and doxycycline     Subjective: Had a full bladder with my examination, RN could not place a catheter, urology consult obtained. Patient still on 3 L oxygen, she does not feel any short of breath.  She has no cough. She is very confused. No fever chills.   Objective: Vitals:   12/01/19 0455 12/01/19 1215 12/01/19 1945 12/02/19 0441  BP:  121/62 133/72 (!) 124/42  Pulse:  72 91 78  Resp:  16 16 15   Temp:  97.7 F (36.5 C) 98.3 F (36.8 C) 98.1 F (36.7 C)  TempSrc:   Oral Axillary  SpO2: 92% 97% 97% 98%  Weight:      Height:  Intake/Output Summary (Last 24 hours) at 12/02/2019 0908 Last data filed at 12/01/2019 1424 Gross per 24 hour  Intake 244.99 ml  Output 900 ml  Net -655.01 ml    Filed Weights   11/26/19 1105  Weight: 61.6 kg    Examination:  General exam: Appears calm and comfortable, ill appearing Respiratory system: Clear to auscultation. Respiratory effort normal. Cardiovascular system: S1 & S2 heard, RRR. No JVD, murmurs, rubs, gallops or clicks.  Gastrointestinal system: Abdomen is nondistended, soft and nontender. No organomegaly or masses felt. Normal bowel sounds heard. Central nervous system: Alert and confused. No focal neurological deficits. Extremities: Bilateral leg edema improved, left leg redness much improved. Skin: No rashes, lesions or ulcers Psychiatry: Mood & affect appropriate.     Data Reviewed: I have personally reviewed following labs and imaging studies  CBC: Recent Labs  Lab 11/26/19 0517 11/27/19 0607 11/29/19 0536 11/30/19 0740 12/02/19 0614  WBC 7.9 9.2 9.9 8.2 10.2  NEUTROABS 6.1  --  8.1* 6.2 7.7  HGB 10.6* 8.3* 9.0* 9.3* 10.1*  HCT 31.4* 26.0* 28.1* 30.6* 32.4*  MCV 98.7 102.4* 102.2* 107.0* 104.2*  PLT 224 228 221 252 233   Basic Metabolic Panel: Recent Labs  Lab 11/27/19 0607 11/29/19 0536 11/30/19 0740 12/01/19 0557 12/02/19 0614  NA 144 151* 152* 151* 148*  K 3.8 3.8 3.9 3.3* 3.0*  CL 104 109 110 104 98  CO2 26 32 41* 39* 35*  GLUCOSE 115* 120* 124* 125* 108*  BUN 32* 28* 25* 26* 31*  CREATININE 0.93 0.64 0.83 0.75 0.82  CALCIUM 8.5* 8.7* 9.4 9.0 9.1  MG  --  2.5* 2.5*  --  2.1   GFR: Estimated Creatinine Clearance: 37 mL/min (by C-G formula based on SCr of 0.82 mg/dL). Liver Function Tests: Recent Labs  Lab 11/26/19 0517  AST 29  ALT 22  ALKPHOS 59  BILITOT 1.0  PROT 7.7  ALBUMIN 4.0   No results for input(s): LIPASE, AMYLASE in the last 168 hours. No results for input(s): AMMONIA in the last 168 hours. Coagulation Profile: Recent Labs  Lab 11/26/19 0517  INR 0.9   Cardiac Enzymes: No results for input(s): CKTOTAL, CKMB, CKMBINDEX, TROPONINI in the last 168 hours. BNP (last 3  results) No results for input(s): PROBNP in the last 8760 hours. HbA1C: No results for input(s): HGBA1C in the last 72 hours. CBG: No results for input(s): GLUCAP in the last 168 hours. Lipid Profile: No results for input(s): CHOL, HDL, LDLCALC, TRIG, CHOLHDL, LDLDIRECT in the last 72 hours. Thyroid Function Tests: No results for input(s): TSH, T4TOTAL, FREET4, T3FREE, THYROIDAB in the last 72 hours. Anemia Panel: No results for input(s): VITAMINB12, FOLATE, FERRITIN, TIBC, IRON, RETICCTPCT in the last 72 hours. Sepsis Labs: Recent Labs  Lab 11/26/19 0517 11/29/19 0536 11/30/19 0740  PROCALCITON  --  0.13 0.15  LATICACIDVEN 0.8  --   --     Recent Results (from the past 240 hour(s))  Urine culture     Status: Abnormal   Collection Time: 11/26/19  5:16 AM   Specimen: Urine, Random  Result Value Ref Range Status   Specimen Description   Final    URINE, RANDOM Performed at [facility], [address], [place], [place] [address]    Special Requests   Final    NONE Performed at [facility], [address], [place], [place] [address]    Culture (A)  Final    30,000 COLONIES/mL MULTIPLE SPECIES PRESENT,  SUGGEST RECOLLECTION   Report Status 11/27/2019 FINAL  Final  SARS Coronavirus 2 by RT PCR (hospital order, performed in [facility] hospital lab) Nasopharyngeal Nasopharyngeal Swab     Status: None   Collection Time: 11/26/19  5:16 AM   Specimen: Nasopharyngeal Swab  Result Value Ref Range Status   SARS Coronavirus 2 NEGATIVE NEGATIVE Final    Comment: (NOTE) SARS-CoV-2 target nucleic acids are NOT DETECTED.  The SARS-CoV-2 RNA is generally detectable in upper and lower respiratory specimens during the acute phase of infection. The lowest concentration of SARS-CoV-2 viral copies this assay can detect is 250 copies / mL. A negative result does not preclude SARS-CoV-2 infection and should not be used as the sole basis for treatment or other patient  management decisions.  A negative result may occur with improper specimen collection / handling, submission of specimen other than nasopharyngeal swab, presence of viral mutation(s) within the areas targeted by this assay, and inadequate number of viral copies (<250 copies / mL). A negative result must be combined with clinical observations, patient history, and epidemiological information.  Fact Sheet for Patients:   StrictlyIdeas.no  Fact Sheet for Healthcare Providers: BankingDealers.co.za  This test is not yet approved or  cleared by the [place] FDA and has been authorized for detection and/or diagnosis of SARS-CoV-2 by FDA under an Emergency Use Authorization (EUA).  This EUA will remain in effect (meaning this test can be used) for the duration of the COVID-19 declaration under Section 564(b)(1) of the Act, 21 U.S.C. section 360bbb-3(b)(1), unless the authorization is terminated or revoked sooner.  Performed at [facility], [address] [place], [place] [address]   Blood culture (routine single)     Status: None   Collection Time: 11/26/19  5:17 AM   Specimen: BLOOD  Result Value Ref Range Status   Specimen Description BLOOD LEFT FA  Final   Special Requests   Final    BOTTLES DRAWN AEROBIC AND ANAEROBIC Blood Culture results may not be optimal due to an excessive volume of blood received in culture bottles   Culture   Final    NO GROWTH 5 DAYS Performed at [facility], [address] [place], [place] [address]    Report Status 12/01/2019 FINAL  Final  MRSA PCR Screening     Status: None   Collection Time: 11/29/19  4:50 PM   Specimen: Nasal Mucosa; Nasopharyngeal  Result Value Ref Range Status   MRSA by PCR NEGATIVE NEGATIVE Final    Comment:        The GeneXpert MRSA Assay (FDA approved for NASAL specimens only), is one component of a comprehensive MRSA colonization surveillance  program. It is not intended to diagnose MRSA infection nor to guide or monitor treatment for MRSA infections. Performed at [facility], [address] [place], [place] [address]          Radiology Studies: DG Chest 1 View  Result Date: 11/30/2019 CLINICAL DATA:  Acute hypoxemic respiratory failure. EXAM: CHEST  1 VIEW COMPARISON:  None. FINDINGS: Normal cardiac silhouette. There are new bilateral patchy airspace disease. Lungs are hyperinflated. Probable bilateral pleural effusions. No pneumothorax. Aggressive osseous lesion. IMPRESSION: Findings suggest flash pulmonary edema and effusions. Multifocal pneumonia could have similar pattern. Electronically Signed   By: Suzy Bouchard M.D.   On: 11/30/2019 14:49   ECHOCARDIOGRAM COMPLETE  Result Date: 12/01/2019    ECHOCARDIOGRAM REPORT   Patient Name:   Chestine Spore Date of  Exam: 11/30/2019 Medical Rec #:  [id number]    Height:       63.0 in Accession #:    [id number]   Weight:       135.8 lb Date of Birth:  1926-08-31    BSA:          1.640 m Patient Age:    36 years     BP:           147/64 mmHg Patient Gender: F            HR:           98 bpm. Exam Location:  [facility] Procedure: 2D Echo, Cardiac Doppler and Color Doppler Indications:     Aortic stenosis 424.1  History:         Patient has no prior history of Echocardiogram examinations.                  Risk Factors:Hypertension. Dementia.  Sonographer:     Sherrie Sport RDCS (AE) Referring Phys:  [id number] Sharen Hones Diagnosing Phys: Serafina Royals MD IMPRESSIONS  1. Left ventricular ejection fraction, by estimation, is 65 to 70%. The left ventricle has normal function. The left ventricle has no regional wall motion abnormalities. Left ventricular diastolic parameters were normal.  2. Right ventricular systolic function is normal. The right ventricular size is normal. There is moderately elevated pulmonary artery systolic pressure.  3. Left atrial size was mildly dilated.  4. The mitral  valve is normal in structure. Mild mitral valve regurgitation.  5. The aortic valve is normal in structure. Aortic valve regurgitation is trivial. Moderate to severe aortic valve stenosis. FINDINGS  Left Ventricle: Left ventricular ejection fraction, by estimation, is 65 to 70%. The left ventricle has normal function. The left ventricle has no regional wall motion abnormalities. The left ventricular internal cavity size was small. There is borderline left ventricular hypertrophy. Left ventricular diastolic parameters were normal. Right Ventricle: The right ventricular size is normal. No increase in right ventricular wall thickness. Right ventricular systolic function is normal. There is moderately elevated pulmonary artery systolic pressure. The tricuspid regurgitant velocity is 3.09 m/s, and with an assumed right atrial pressure of 10 mmHg, the estimated right ventricular systolic pressure is 54.6 mmHg. Left Atrium: Left atrial size was mildly dilated. Right Atrium: Right atrial size was normal in size. Pericardium: A small pericardial effusion is present. Mitral Valve: The mitral valve is normal in structure. Mild mitral valve regurgitation. Tricuspid Valve: The tricuspid valve is normal in structure. Tricuspid valve regurgitation is mild. Aortic Valve: The aortic valve is normal in structure. Aortic valve regurgitation is trivial. Moderate to severe aortic stenosis is present. Aortic valve mean gradient measures 36.3 mmHg. Aortic valve peak gradient measures 62.0 mmHg. Aortic valve area, by VTI measures 0.89 cm. Pulmonic Valve: The pulmonic valve was normal in structure. Pulmonic valve regurgitation is not visualized. Aorta: The aortic root and ascending aorta are structurally normal, with no evidence of dilitation. IAS/Shunts: No atrial level shunt detected by color flow Doppler.  LEFT VENTRICLE PLAX 2D LVIDd:         4.04 cm  Diastology LVIDs:         1.79 cm  LV e' lateral:   6.42 cm/s LV PW:         1.21 cm   LV E/e' lateral: 17.1 LV IVS:        1.23 cm  LV e' medial:    5.77 cm/s LVOT diam:  2.00 cm  LV E/e' medial:  19.1 LV SV:         63 LV SV Index:   39 LVOT Area:     3.14 cm  RIGHT VENTRICLE RV Basal diam:  3.73 cm RV S prime:     16.40 cm/s TAPSE (M-mode): 3.0 cm LEFT ATRIUM         Index LA diam:    2.90 cm 1.77 cm/m  AORTIC VALVE                    PULMONIC VALVE AV Area (Vmax):    0.85 cm     PV Vmax:        1.00 m/s AV Area (Vmean):   0.71 cm     PV Peak grad:   4.0 mmHg AV Area (VTI):     0.89 cm     RVOT Peak grad: 9 mmHg AV Vmax:           393.67 cm/s AV Vmean:          281.667 cm/s AV VTI:            0.714 m AV Peak Grad:      62.0 mmHg AV Mean Grad:      36.3 mmHg LVOT Vmax:         107.00 cm/s LVOT Vmean:        63.800 cm/s LVOT VTI:          0.202 m LVOT/AV VTI ratio: 0.28  AORTA Ao Root diam: 2.80 cm MITRAL VALVE                TRICUSPID VALVE MV Area (PHT): 3.60 cm     TR Peak grad:   38.2 mmHg MV Decel Time: 211 msec     TR Vmax:        309.00 cm/s MV E velocity: 110.00 cm/s MV A velocity: 138.00 cm/s  SHUNTS MV E/A ratio:  0.80         Systemic VTI:  0.20 m                             Systemic Diam: 2.00 cm Serafina Royals MD Electronically signed by Serafina Royals MD Signature Date/Time: 12/01/2019/9:10:05 AM    Final         Scheduled Meds: . amLODipine  10 mg Oral Daily  . amoxicillin-clavulanate  1 tablet Oral BID  . aspirin EC  81 mg Oral Daily  . benazepril  10 mg Oral Daily  . cholecalciferol  1,000 Units Oral Daily  . collagenase   Topical Daily  . doxycycline  100 mg Oral Q12H  . enoxaparin (LOVENOX) injection  40 mg Subcutaneous Q24H  . galantamine  8 mg Oral Q breakfast  . LORazepam  2 mg Intramuscular Once  . metolazone  5 mg Oral Daily  . QUEtiapine  25 mg Oral QHS  . sodium chloride flush  3 mL Intravenous Q12H  . vitamin B-12  1,000 mcg Oral Daily   Continuous Infusions: . sodium chloride Stopped (12/01/19 1417)  . potassium chloride 10 mEq (12/02/19  0901)     LOS: 6 days    Time spent: 38 minutes    Sharen Hones, MD [facility]   To contact the attending provider between 7A-7P or the covering provider during after hours 7P-7A, please log into the web site [email or web address] and access using universal [place] password  for that web site. If you do not have the password, please call the hospital operator.  12/02/2019, 9:08 AM

## 2019-12-02 NOTE — Progress Notes (Signed)
Per Dr. Chipper Herb okay for pt to be without an IV. RN will continue to assess and monitor pt.

## 2019-12-02 NOTE — TOC Progression Note (Addendum)
Transition of Care [facility]) - Progression Note    Patient Details  Name: Dominique Walton MRN: [id number] Date of Birth: 11-May-1926  Transition of Care [facility]) CM/SW Contact  Marina Goodell Phone Number: [phone]   12/02/2019, 11:49 AM  Clinical Narrative:     CSW spoke to patient's son Dorinda Hill and daughter-in-law, Massie Kluver about hospice recommended by Attending.  This CSW exlpained to family placement process, time line and possible obstacles to placement. The patient is currently living at [facility]), and is private pay, although the family states they will only be able to private pay for another 6-9 months and they will need to apply for Medicaid.  This CSW spoke with them about reaching out to social services or requesting assistance from [facility] for [facility] application, once the patient's private pay runs out.  The family is asked questions about SNF placement and this CSW spoke with them about Attending's recommendation for hospice and Ms. Froberg stated she understood from speaking with Attending, the patient may have declined too much to participate in PT.  Patient has been accepted by [facility], for SNF, but the family sated they would like to place the patient in long-term care with hospice.  This CSW gave the family information for medicare.gov for long term care facilities in the area and also recommended Googling long term facilities.  Family verbalized understanding.  This CSW contacted [employer] [phone] and spoke with Elonda Husky RN, to update on patient status and to ask about hospice care.  Cassandra RN stated the patient will need to be reassessed to ensure she can "at least pivot to sit", if the patient is unable to meet requirements for [facility], then another placement will need to be found.  Cassandra RN stated the CSW will need to call [phone] for assessment information. Family was made aware of this.  CSW called family  and spoke with Si Gaul (DIL) after speaking with Dionne Milo, and updated her on requirements for [facility] Milo and hospice care.  Ms. Onofrio verbalized understanding and she stated she will research other long term facilities in the area.   Expected Discharge Plan: Skilled Nursing Facility Barriers to Discharge: English as a second language teacher, SNF Pending bed offer  Expected Discharge Plan and Services Expected Discharge Plan: Skilled Nursing Facility     Post Acute Care Choice: Skilled Nursing Facility, Resumption of Svcs/PTA Provider Living arrangements for the past 2 months: Assisted Living Facility (Memory Care)                                       Social Determinants of Health (SDOH) Interventions    Readmission Risk Interventions No flowsheet data found.

## 2019-12-02 NOTE — Procedures (Signed)
   UROLOGY PROCEDURE NOTE  Indication: Urinary retention, bladder scan over 1 L, nursing unable to place catheter  Briefly, extremely frail appearing 86 year old female admitted with left leg cellulitis and acute on chronic congestive heart failure who was found to have a bladder scan of greater than 1 L.  Nursing unable to place catheter secondary to atrophic and retracted meatus.  The patient was prepped and draped in standard sterile fashion.  On exam, there was significant narrowing of the introitus and unable to visualize the meatus.  Patient was also combative requiring 2 nurses for positioning.  With some difficulty I was ultimately able to navigate a 16 [place] coud catheter into the bladder with return of clear yellow urine.  I needed to place a finger in the vagina and slide the catheter over my finger into the meatus.  10 cc were placed in the balloon, the catheter was connected to dependent drainage, and the Foley was secured to the thigh.  Plan: Maintain Foley at least 1 to 2 weeks until clinically improved  Legrand Rams, MD 12/02/2019

## 2019-12-03 ENCOUNTER — Other Ambulatory Visit: Payer: Self-pay

## 2019-12-03 DIAGNOSIS — E870 Hyperosmolality and hypernatremia: Secondary | ICD-10-CM

## 2019-12-03 DIAGNOSIS — R627 Adult failure to thrive: Secondary | ICD-10-CM

## 2019-12-03 LAB — BASIC METABOLIC PANEL
Anion gap: 10 (ref 5–15)
BUN: 32 mg/dL — ABNORMAL HIGH (ref 8–23)
CO2: 35 mmol/L — ABNORMAL HIGH (ref 22–32)
Calcium: 9 mg/dL (ref 8.9–10.3)
Chloride: 106 mmol/L (ref 98–111)
Creatinine, Ser: 0.85 mg/dL (ref 0.44–1.00)
GFR calc Af Amer: >60 mL/min (ref >60)
GFR calc non Af Amer: 60 mL/min — ABNORMAL LOW (ref >60)
Glucose, Bld: 104 mg/dL — ABNORMAL HIGH (ref 70–99)
Potassium: 4.1 mmol/L (ref 3.5–5.1)
Sodium: 151 mmol/L — ABNORMAL HIGH (ref 135–145)

## 2019-12-03 LAB — MAGNESIUM: Magnesium: 2 mg/dL (ref 1.7–2.4)

## 2019-12-03 NOTE — Progress Notes (Signed)
Occupational Therapy Treatment Patient Details Name: Dominique Walton MRN: [id number] DOB: 16-Oct-1926 Today's Date: 12/03/2019    History of present illness 86 y.o. female with history of dementia and hypertension presents to the emergency department via EMS following unwitnessed fall from [facility].  Patient noted to have a contusion to the left forehead and inferiorly to the right eye..  Patient also noted to have a weeping wound on the left lower extremity per EMS.  Patient admits to hurting all over.   OT comments  Pt seated in recliner chair upon entering the room and appearing very restless. Pt agreeable to return to bed with mod A +2 for sit <> stand and stand pivot transfer back to bed with HHA. Pt eyes closed once standing and needing mod cuing to remain alert for transfer. Sit >supine with +2 for safety. Pt given warm cloth and initiates washing hands and face with min cuing and increased time. Pt requesting assistance for oral care. OT set up needed items and pt initiated brushing teeth with min A for thoroughness. Pt very pleasant this session but does fatigue quickly. Pt verbalized , "Oww my leg hurts" and RN giving medication this session. Pt following simple 1 step commands with greater ease this session. Pt continues to benefit from OT intervention.   Follow Up Recommendations  Other (comment) (family request return to [facility] with hospice)    Equipment Recommendations  None recommended by OT       Precautions / Restrictions Precautions Precautions: Fall Restrictions Weight Bearing Restrictions: No       Mobility Bed Mobility Overal bed mobility: Needs Assistance Bed Mobility: Sit to Supine;Rolling Rolling: Min assist   Supine to sit: Mod assist;Max assist;+2 for safety/equipment Sit to supine: +2 for safety/equipment;Mod assist;Max assist   General bed mobility comments: mod multimodal cuing and assist with transition back to bed for safety  Transfers Overall  transfer level: Needs assistance Equipment used: 2 person hand held assist Transfers: Sit to/from Stand;Stand Pivot Transfers Sit to Stand: Mod assist;+2 physical assistance Stand pivot transfers: Mod assist;+2 physical assistance       General transfer comment: assistance to stand from recliner chair. Pt appears very fatigued at this point and keeps eyes closed when asked to pivot into bed needing mod +2 HHA for safety    Balance Overall balance assessment: Needs assistance Sitting-balance support: No upper extremity supported;Feet supported Sitting balance-Leahy Scale: Fair Sitting balance - Comments: static sitting with close sup   Standing balance support: Bilateral upper extremity supported Standing balance-Leahy Scale: Poor       ADL either performed or assessed with clinical judgement   ADL Overall ADL's : Needs assistance/impaired     Grooming: Wash/dry hands;Wash/dry face;Oral care;Minimal assistance;Bed level         General ADL Comments: Pt following commands for self care tasks but needing increased time to sequence and initiate     Vision Baseline Vision/History: Wears glasses Wears Glasses: Reading only Patient Visual Report: No change from baseline            Cognition Arousal/Alertness: Awake/alert Behavior During Therapy: Restless Overall Cognitive Status: History of cognitive impairments - at baseline     General Comments: Ox self only; follows simple one step commands for novel tasks        Exercises Other Exercises Other Exercises: Light grooming in supported sitting in recliner, set up/supervision to wash face. Other Exercises: Assisted with drinking per patient request-mild coughing noted when drinking with  straw; may benefit from small sips from cup (without straw)           Pertinent Vitals/ Pain       Pain Assessment: No/denies pain Faces Pain Scale: Hurts even more Pain Location: L LE Pain Descriptors / Indicators:  Discomfort;Guarding;Grimacing Pain Intervention(s): Limited activity within patient's tolerance;RN gave pain meds during session;Repositioned;Monitored during session  Home Living        Additional Comments: [facility] - memory care unit          Frequency  Min 1X/week        Progress Toward Goals  OT Goals(current goals can now be found in the care plan section)  Progress towards OT goals: Progressing toward goals  Acute Rehab OT Goals Patient Stated Goal: to brush my teeth OT Goal Formulation: With patient Time For Goal Achievement: 12/11/19 Potential to Achieve Goals: Fair  Plan Discharge plan remains appropriate       AM-PAC OT "6 Clicks" Daily Activity     Outcome Measure   Help from another person eating meals?: A Little Help from another person taking care of personal grooming?: A Little Help from another person toileting, which includes using toliet, bedpan, or urinal?: Total Help from another person bathing (including washing, rinsing, drying)?: A Lot Help from another person to put on and taking off regular upper body clothing?: A Lot Help from another person to put on and taking off regular lower body clothing?: Total 6 Click Score: 12    End of Session Equipment Utilized During Treatment: Oxygen (1.5 L via [place])  OT Visit Diagnosis: Muscle weakness (generalized) (M62.81);History of falling (Z91.81)   Activity Tolerance Patient tolerated treatment well   Patient Left in bed;with call bell/phone within reach;with bed alarm set;with restraints reapplied;with nursing/sitter in room   Nurse Communication Mobility status;Precautions        Time: 6073-7106 OT Time Calculation (min): 23 min  Charges: OT General Charges $OT Visit: 1 Visit OT Treatments $Self Care/Home Management : 23-37 mins  Jackquline Denmark, MS, OTR/L , CBIS ascom [phone]  12/03/19, 2:43 PM

## 2019-12-03 NOTE — TOC Progression Note (Addendum)
Transition of Care [facility]) - Progression Note    Patient Details  Name: Dominique Walton MRN: [id number] Date of Birth: Nov 22, 1926  [facility] CM/SW Contact  Margarito Liner, LCSW Phone Number: 12/03/2019, 9:59 AM  Clinical Narrative:  Reita Chard from [facility] will be here around 11:00 to assess patient. RN aware.   1:38 pm: Assessment complete. CSW tried calling Ed to follow up on decision. Staff member that answered said that he forwarded the information to [place], the memory care Interior and spatial designer, so I would need to speak to her. CSW left her a voicemail.  2:56 pm: [facility] able to accept patient back with hospice services. [facility] will order oxygen and a hospital bed. Will plan for discharge tomorrow. Son and daughter-in-law are aware.  Expected Discharge Plan: Skilled Nursing Facility Barriers to Discharge: English as a second language teacher, SNF Pending bed offer  Expected Discharge Plan and Services Expected Discharge Plan: Skilled Nursing Facility     Post Acute Care Choice: Skilled Nursing Facility, Resumption of Svcs/PTA Provider Living arrangements for the past 2 months: Assisted Living Facility (Memory Care)                                       Social Determinants of Health (SDOH) Interventions    Readmission Risk Interventions No flowsheet data found.

## 2019-12-03 NOTE — Progress Notes (Signed)
Physical Therapy Treatment Patient Details Name: Dominique Walton MRN: [id number] DOB: Dec 29, 1926 Today's Date: 12/03/2019    History of Present Illness 86 y.o. female with history of dementia and hypertension presents to the emergency department via EMS following unwitnessed fall from [facility].  Patient noted to have a contusion to the left forehead and inferiorly to the right eye..  Patient also noted to have a weeping wound on the left lower extremity per EMS.  Patient admits to hurting all over.    PT Comments    Patient remains generally confused to location, situation, but does follow simple verbal commands and gestures throughout session.  Does express desire to "get up"; able to complete OOB to chair with bilat HHA, mod assist +2 for safety. Requires assist for movement initiation, lift off, postural extension/control and overall balance; very short, shuffling steps noted with transfer.  High risk for fall; do recommend +2 with all transfer attempts and use of manual WC as primary mobility at discharge.    Follow Up Recommendations   (per TOC, plan per family request is return to [facility] with hospice)     Equipment Recommendations       Recommendations for Other Services       Precautions / Restrictions Precautions Precautions: Fall Restrictions Weight Bearing Restrictions: No    Mobility  Bed Mobility Overal bed mobility: Needs Assistance Bed Mobility: Supine to Sit     Supine to sit: Mod assist;Max assist;+2 for safety/equipment     General bed mobility comments: assist to initiate movement transition; does actively assist with transition as able  Transfers Overall transfer level: Needs assistance Equipment used: 2 person hand held assist Transfers: Sit to/from Stand Sit to Stand: Mod assist;+2 physical assistance         General transfer comment: assist for lift off, postural extension/control and overall standing  balance  Ambulation/Gait Ambulation/Gait assistance: Mod assist;+2 physical assistance Gait Distance (Feet): 5 Feet Assistive device: 2 person hand held assist       General Gait Details: very short, shuffling steps; forward trunk lean; poor dynamic balance.  does require +2 at all times; may consider trial of RW (though question ability to fully integrate new movement pattern due to advanced dementia)   Stairs             Wheelchair Mobility    Modified Rankin (Stroke Patients Only)       Balance Overall balance assessment: Needs assistance Sitting-balance support: No upper extremity supported;Feet supported Sitting balance-Leahy Scale: Fair Sitting balance - Comments: static sitting with close sup   Standing balance support: Bilateral upper extremity supported Standing balance-Leahy Scale: Poor                              Cognition Arousal/Alertness: Awake/alert Behavior During Therapy: Restless Overall Cognitive Status: History of cognitive impairments - at baseline                                 General Comments: patient oriented to self only; follows simple commands with gestures/demonstration from therapist      Exercises Other Exercises Other Exercises: Light grooming in supported sitting in recliner, set up/supervision to wash face. Other Exercises: Assisted with drinking per patient request-mild coughing noted when drinking with straw; may benefit from small sips from cup (without straw)    General Comments  Pertinent Vitals/Pain Pain Assessment: Faces Faces Pain Scale: Hurts even more Pain Location: L LE Pain Descriptors / Indicators: Discomfort;Guarding;Grimacing Pain Intervention(s): Limited activity within patient's tolerance;Monitored during session;Repositioned    Home Living                      Prior Function            PT Goals (current goals can now be found in the care plan section)  Acute Rehab PT Goals Patient Stated Goal: to return to [facility] PT Goal Formulation: Patient unable to participate in goal setting Time For Goal Achievement: 12/12/19 Potential to Achieve Goals: Fair Progress towards PT goals: Progressing toward goals    Frequency    Min 2X/week      PT Plan Current plan remains appropriate    Co-evaluation              AM-PAC PT "6 Clicks" Mobility   Outcome Measure  Help needed turning from your back to your side while in a flat bed without using bedrails?: A Lot Help needed moving from lying on your back to sitting on the side of a flat bed without using bedrails?: A Lot Help needed moving to and from a bed to a chair (including a wheelchair)?: A Lot Help needed standing up from a chair using your arms (e.g., wheelchair or bedside chair)?: A Lot Help needed to walk in hospital room?: A Lot Help needed climbing 3-5 steps with a railing? : Total 6 Click Score: 11    End of Session Equipment Utilized During Treatment: Gait belt;Oxygen Activity Tolerance: Patient tolerated treatment well Patient left: in chair;with call bell/phone within reach;with chair alarm set;with nursing/sitter in room;with family/visitor present Nurse Communication: Mobility status PT Visit Diagnosis: Muscle weakness (generalized) (M62.81);Difficulty in walking, not elsewhere classified (R26.2);History of falling (Z91.81)     Time: 5102-5852 PT Time Calculation (min) (ACUTE ONLY): 23 min  Charges:  $Therapeutic Activity: 23-37 mins                      Allyanna Appleman H. Manson Passey, PT, DPT, NCS 12/03/19, 11:30 AM [phone]

## 2019-12-03 NOTE — Progress Notes (Addendum)
PROGRESS NOTE    Dominique Walton  BHA:[id number] DOB: [birth year] DOA: 11/26/2019 PCP: Rusty Aus, MD   Chief complaint.  Hypoxia.  Brief Narrative:  Dominique Walton a 86 y.o.femalewith medical history significant for dementia, hypertension, anxiety, was brought to the hospital because of a fall while the assisted living facility. Apparently, she is on the memory care unit of the facility. She was found to have left leg cellulitis. She was treated with IV Ancef and analgesics. Wound care nurse was consulted for wounds on the left leg.  8/25.Left legcellulitis not improving, added doxycycline to cover MRSA. Talk with patient son, patient's DO NOT RESUSCITATE status.  8/27.Patient has increased oxygen requirement to 5 L today. Obtain chest x-ray. Patient also has significant murmur suggesting aortic stenosis, obtain echocardiogram. Increased crackles today, give 20 mg Lasix in addition to metolazone 5 mg.  8/28.Echocardiogram showed ejection fraction normal, moderate to severe aortic stenosis. Still has significant volume overload, given 40 mg IV Lasix in addition to 5 mg metolazone daily. Give 500 mL D5 water for hypernatremia.  8/29.  Urinary retention over 1000 mL, RN failed to place a catheter, urology consulted for Foley placement.  6/30.  Had a conversation with the patient family, decided to go with hospice.    Assessment & Plan:   Active Problems:   Cellulitis of left leg   Alzheimer's dementia ([place])   Essential hypertension   Aortic stenosis, severe   Acute on chronic diastolic CHF (congestive heart failure) (HCC)   Hypernatremia  #1.  Acute hypoxemic respite failure secondary to acute on chronic diastolic congestive heart failure. Continue metolazone.  2.  Acute on chronic diastolic congestive heart failure with a moderate to severe aortic stenosis. Continue diuretics with metolazone.  3.  Hypernatremia. Secondary to poor p.o. intake.  Diuretics  were changed to metolazone.  Condition not improving, this unlikely improve as the patient has not been eating.  4.  Left leg cellulitis. Condition much improved.  Continue Augmentin and doxycycline.  5.  Urinary retention. Foley catheter anchored.  We will continue for comfort.  6.  Adult failure to thrive per  7.  Alzheimer dementia   DVT prophylaxis:Lovenox  Code Status:DNR Family Communication:none  Disposition Plan:  Patient came from:ALF  Anticipated d/c place:SNF tomorrow.  Barriers to d/c OR conditions which need to be met to effect a safe d/c:   Consultants:  None  Procedures:None Antimicrobials: Augmentinand doxycycline   Subjective: Patient is still confused.  Had a very little p.o. intake.  No nausea vomiting abdominal pain.  No fever or chills. Short of breath with exertion.  Objective: Vitals:   12/02/19 0924 12/02/19 2034 12/03/19 0437 12/03/19 0607  BP: 102/72 134/75 (!) 129/97 (!) 104/48  Pulse: 95 91 (!) 114 (!) 103  Resp: _0 Temp: 98.8 F (37.1 C) 98.4 F (36.9 C) 99.6 F (37.6 C)   TempSrc: Axillary Oral Oral   SpO2:  (!) 82% (!) 87% 99%  Weight:      Height:        Intake/Output Summary (Last 24 hours) at 12/03/2019 0928 Last data filed at 12/02/2019 1159 Gross per 24 hour  Intake --  Output 1450 ml  Net -1450 ml   Filed Weights   11/26/19 1105  Weight: 61.6 kg    Examination:  General exam: Appears calm and comfortable, ill-appearing. Respiratory system: Mild crackles in the base bilaterally.   Respiratory effort normal. Cardiovascular system: S1 & S2 heard,  RRR. No JVD, 2/6 systolic murmur Gastrointestinal system: Abdomen is nondistended, soft and nontender. No organomegaly or masses felt. Normal bowel sounds heard. Central nervous system: Alert and oriented x1. No focal  neurological deficits. Extremities: Symmetric  Skin: Decubitus ulcer.    Data Reviewed: I have personally reviewed following labs and imaging studies  CBC: Recent Labs  Lab 11/27/19 0607 11/29/19 0536 11/30/19 0740 12/02/19 0614  WBC 9.2 9.9 8.2 10.2  NEUTROABS  --  8.1* 6.2 7.7  HGB 8.3* 9.0* 9.3* 10.1*  HCT 26.0* 28.1* 30.6* 32.4*  MCV 102.4* 102.2* 107.0* 104.2*  PLT 228 221 252 951   Basic Metabolic Panel: Recent Labs  Lab 11/29/19 0536 11/30/19 0740 12/01/19 0557 12/02/19 0614 12/03/19 0725  NA 151* 152* 151* 148* 151*  K 3.8 3.9 3.3* 3.0* 4.1  CL 109 110 104 98 106  CO2 32 41* 39* 35* 35*  GLUCOSE 120* 124* 125* 108* 104*  BUN 28* 25* 26* 31* 32*  CREATININE 0.64 0.83 0.75 0.82 0.85  CALCIUM 8.7* 9.4 9.0 9.1 9.0  MG 2.5* 2.5*  --  2.1 2.0   GFR: Estimated Creatinine Clearance: 35.7 mL/min (by C-G formula based on SCr of 0.85 mg/dL). Liver Function Tests: No results for input(s): AST, ALT, ALKPHOS, BILITOT, PROT, ALBUMIN in the last 168 hours. No results for input(s): LIPASE, AMYLASE in the last 168 hours. No results for input(s): AMMONIA in the last 168 hours. Coagulation Profile: No results for input(s): INR, PROTIME in the last 168 hours. Cardiac Enzymes: No results for input(s): CKTOTAL, CKMB, CKMBINDEX, TROPONINI in the last 168 hours. BNP (last 3 results) No results for input(s): PROBNP in the last 8760 hours. HbA1C: No results for input(s): HGBA1C in the last 72 hours. CBG: No results for input(s): GLUCAP in the last 168 hours. Lipid Profile: No results for input(s): CHOL, HDL, LDLCALC, TRIG, CHOLHDL, LDLDIRECT in the last 72 hours. Thyroid Function Tests: No results for input(s): TSH, T4TOTAL, FREET4, T3FREE, THYROIDAB in the last 72 hours. Anemia Panel: No results for input(s): VITAMINB12, FOLATE, FERRITIN, TIBC, IRON, RETICCTPCT in the last 72 hours. Sepsis Labs: Recent Labs  Lab 11/29/19 0536 11/30/19 0740  PROCALCITON 0.13 0.15     Recent Results (from the past 240 hour(s))  Urine culture     Status: Abnormal   Collection Time: 11/26/19  5:16 AM   Specimen: Urine, Random  Result Value Ref Range Status   Specimen Description   Final    URINE, RANDOM Performed at [facility], [address], [place], [place] [address]    Special Requests   Final    NONE Performed at [facility], [address] [place], [place] [address]    Culture (A)  Final    30,000 COLONIES/mL MULTIPLE SPECIES PRESENT, SUGGEST RECOLLECTION   Report Status 11/27/2019 FINAL  Final  SARS Coronavirus 2 by RT PCR (hospital order, performed in [place] hospital lab) Nasopharyngeal Nasopharyngeal Swab     Status: None   Collection Time: 11/26/19  5:16 AM   Specimen: Nasopharyngeal Swab  Result Value Ref Range Status   SARS Coronavirus 2 NEGATIVE NEGATIVE Final    Comment: (NOTE) SARS-CoV-2 target nucleic acids are NOT DETECTED.  The SARS-CoV-2 RNA is generally detectable in upper and lower respiratory specimens during the acute phase of infection. The lowest concentration of SARS-CoV-2 viral copies this assay can detect is 250 copies / mL. A negative result does not preclude SARS-CoV-2 infection and should not be used as  the sole basis for treatment or other patient management decisions.  A negative result may occur with improper specimen collection / handling, submission of specimen other than nasopharyngeal swab, presence of viral mutation(s) within the areas targeted by this assay, and inadequate number of viral copies (<250 copies / mL). A negative result must be combined with clinical observations, patient history, and epidemiological information.  Fact Sheet for Patients:   StrictlyIdeas.no  Fact Sheet for Healthcare Providers: BankingDealers.co.za  This test is not yet approved or  cleared by the [place] FDA and has been authorized for detection  and/or diagnosis of SARS-CoV-2 by FDA under an Emergency Use Authorization (EUA).  This EUA will remain in effect (meaning this test can be used) for the duration of the COVID-19 declaration under Section 564(b)(1) of the Act, 21 U.S.C. section 360bbb-3(b)(1), unless the authorization is terminated or revoked sooner.  Performed at [facility], [address] [place], [place] [address]   Blood culture (routine single)     Status: None   Collection Time: 11/26/19  5:17 AM   Specimen: BLOOD  Result Value Ref Range Status   Specimen Description BLOOD LEFT FA  Final   Special Requests   Final    BOTTLES DRAWN AEROBIC AND ANAEROBIC Blood Culture results may not be optimal due to an excessive volume of blood received in culture bottles   Culture   Final    NO GROWTH 5 DAYS Performed at [facility], [address] [place], [place] [address]    Report Status 12/01/2019 FINAL  Final  MRSA PCR Screening     Status: None   Collection Time: 11/29/19  4:50 PM   Specimen: Nasal Mucosa; Nasopharyngeal  Result Value Ref Range Status   MRSA by PCR NEGATIVE NEGATIVE Final    Comment:        The GeneXpert MRSA Assay (FDA approved for NASAL specimens only), is one component of a comprehensive MRSA colonization surveillance program. It is not intended to diagnose MRSA infection nor to guide or monitor treatment for MRSA infections. Performed at [facility], [address], [place], [place] [address]          Radiology Studies: No results found.      Scheduled Meds: . amLODipine  10 mg Oral Daily  . amoxicillin-clavulanate  1 tablet Oral BID  . aspirin EC  81 mg Oral Daily  . benazepril  10 mg Oral Daily  . Chlorhexidine Gluconate Cloth  6 each Topical Daily  . cholecalciferol  1,000 Units Oral Daily  . collagenase   Topical Daily  . doxycycline  100 mg Oral Q12H  . enoxaparin (LOVENOX) injection  40 mg Subcutaneous Q24H  . galantamine  8 mg  Oral Q breakfast  . LORazepam  2 mg Intramuscular Once  . metolazone  5 mg Oral Daily  . QUEtiapine  25 mg Oral QHS  . sodium chloride flush  3 mL Intravenous Q12H  . vitamin B-12  1,000 mcg Oral Daily   Continuous Infusions: . sodium chloride Stopped (12/01/19 1417)     LOS: 7 days    Time spent: 28 minutes    Sharen Hones, MD [facility]   To contact the attending provider between 7A-7P or the covering provider during after hours 7P-7A, please log into the web site [email or web address] and access using universal [place] password for that web site. If you do not have the password, please call the hospital operator.  12/03/2019, 9:28 AM

## 2019-12-03 NOTE — Care Management Important Message (Signed)
Important Message  Patient Details  Name: Dominique Walton MRN: [id number] Date of Birth: 12-17-1926   Medicare Important Message Given:  Yes     [name] 12/03/2019, 1:22 PM

## 2019-12-03 NOTE — Progress Notes (Signed)
Civil engineer, contracting hospital liaison note:  New referral for [employer] hospice services at [facility] received from [facility]. Patient information sent to referral. Hospice eligibility confirmed. DME needs discussed with TOC. Patient will need a hospital bed and oxygen in place prior to discharge.  Writer spoke on the phone to patient's daughter in law [name] to initiate education regarding hospice services, philosophy and team approach to care with understanding voiced. Questions answered. Hospice contact numbers given to [facility]. DME will be delivered tomorrow between 10-12 with planned discharge to follow. Please have signed out of facility DNR in place. Patient will need non-emergent transport, TOC Charlynn Court aware. Will continue to follow through discharge. Thank you for the opportunity to be involved in the care of this patient and her family. Dayna Barker BSN, RN [facility] [employer] [phone]

## 2019-12-04 DIAGNOSIS — R627 Adult failure to thrive: Secondary | ICD-10-CM

## 2019-12-04 MED ORDER — METOLAZONE 5 MG PO TABS: 5.0000 mg | ORAL_TABLET | Freq: Every day | ORAL | 0 refills | Status: AC

## 2019-12-04 MED ORDER — ALPRAZOLAM 0.25 MG PO TABS: 0.2500 mg | ORAL_TABLET | Freq: Two times a day (BID) | ORAL | 0 refills | Status: AC | PRN

## 2019-12-04 MED ORDER — DOXYCYCLINE HYCLATE 100 MG PO TABS: 100.0000 mg | ORAL_TABLET | Freq: Two times a day (BID) | ORAL | 0 refills | Status: AC

## 2019-12-04 MED ORDER — AMOXICILLIN-POT CLAVULANATE 500-125 MG PO TABS: 1.0000 | ORAL_TABLET | Freq: Two times a day (BID) | ORAL | 0 refills | Status: AC

## 2019-12-04 NOTE — TOC Transition Note (Signed)
Transition of Care [facility]) - CM/SW Discharge Note   Patient Details  Name: Dominique Walton MRN: [id number] Date of Birth: Aug 22, 1926  Transition of Care [facility]) CM/SW Contact:  Margarito Liner, LCSW Phone Number: 12/04/2019, 1:17 PM   Clinical Narrative:  Patient has orders to discharge back to [facility] ([place] today with hospice services. Hospital bed and oxygen have been delivered to the facility. RN will call report to [phone]. EMS transport has been arranged. There are several in front of her on the list. Discharge summary and signed FL2 have been faxed to director at the Memory Care unit. No further concerns. CSW signing off.   Final next level of care: Home w [facility]) Barriers to Discharge: Barriers Resolved   Patient Goals and CMS Choice Patient states their goals for this hospitalization and ongoing recovery are:: Patient not fully oriented. CMS Medicare.gov Compare Post Acute Care list provided to:: Patient Represenative (must comment) (Instructed son and daughter-in-law on how to access online.) Choice offered to / list presented to : Adult Children  Discharge Placement                Patient to be transferred to facility by: EMS Name of family member notified: Roe Coombs and Si Gaul Patient and family notified of of transfer: 12/04/19  Discharge Plan and Services     Post Acute Care Choice: Skilled Nursing Facility, Resumption of Svcs/PTA Provider          DME Arranged: Hospital bed, Oxygen DME Agency: Other - Comment Marcell Anger) Date DME Agency Contacted: 12/03/19   Representative spoke with at DME Agency: Dayna Barker            Social Determinants of Health (SDOH) Interventions     Readmission Risk Interventions No flowsheet data found.

## 2019-12-04 NOTE — Discharge Summary (Signed)
Physician Discharge Summary  Patient ID: Dominique Walton MRN: [id number] DOB/AGE: 84-Jan-1928 84 y.o.  Admit date: 11/26/2019 Discharge date: 12/04/2019  Admission Diagnoses:  Discharge Diagnoses:  Active Problems:   Cellulitis of left leg   Alzheimer's dementia (HCC)   Essential hypertension   Aortic stenosis, severe   Acute on chronic diastolic CHF (congestive heart failure) (HCC)   Hypernatremia   Adult failure to thrive   Discharged Condition: poor  Hospital Course:  Dominique C Cardenis a 86 y.o.femalewith medical history significant for dementia, hypertension, anxiety, was brought to the hospital because of a fall while the assisted living facility. Apparently, she is on the memory care unit of the facility. She was found to have left leg cellulitis. She was treated with IV Ancef and analgesics. Wound care nurse was consulted for wounds on the left leg.  8/25.Left legcellulitis not improving, added doxycycline to cover MRSA. Talk with patient son, patient's DO NOT RESUSCITATE status.  8/27.Patient has increased oxygen requirement to 5 L today. Obtain chest x-ray. Patient also has significant murmur suggesting aortic stenosis, obtain echocardiogram. Increased crackles today, give 20 mg Lasix in addition to metolazone 5 mg.  8/28.Echocardiogram showed ejection fraction normal, moderate to severe aortic stenosis. Still has significant volume overload, given 40 mg IV Lasix in addition to 5 mg metolazone daily. Give 500 mL D5 water for hypernatremia.  8/29.Urinary retention over 1000 mL, RN failed to place a catheter, urology consulted for Foley placement.  6/30.  Had a conversation with the patient family, decided to go with hospice.  6/31.  Patient is except to SNF with hospice care.  #1.  Acute hypoxemic respiratory failure secondary to acute on chronic diastolic congestive heart failure. Continue metolazone.  2.  Acute on chronic diastolic congestive heart  failure with a moderate to severe aortic stenosis. Continue diuretics with metolazone.  3.  Hypernatremia. Secondary to poor p.o. intake.  Diuretics were changed to metolazone.  Condition not improving, this unlikely improve as the patient has not been eating.  4.  Left leg cellulitis. Condition much improved.  Continue Augmentin and doxycycline.  5.  Urinary retention. Foley catheter anchored.  We will continue for comfort.  6.  Adult failure to thrive.  7.  Alzheimer dementia  Consults: Palliative care  Significant Diagnostic Studies:   Treatments: Antibiotics and diuretics Discharge Exam: Blood pressure 106/83, pulse (!) 59, temperature 98.2 F (36.8 C), resp. rate 16, height 5\' 3"  (1.6 m), weight 61.6 kg, SpO2 91 %. General appearance: Ill-appearing, alert, confused. Resp: clear to auscultation bilaterally Cardio: Regular, 2/6 systolic murmur at SUSB GI: Soft, no tenderness Extremities: No edema  Disposition: Discharge disposition: 03-Skilled Nursing Facility       Discharge Instructions    Diet - low sodium heart healthy   Complete by: As directed    Discharge wound care:   Complete by: As directed    RN dressing changes   Increase activity slowly   Complete by: As directed      Allergies as of 12/04/2019   No Known Allergies     Medication List    STOP taking these medications   cloNIDine 0.1 MG tablet Commonly known as: CATAPRES   furosemide 40 MG tablet Commonly known as: LASIX   ibuprofen 200 MG tablet Commonly known as: ADVIL     TAKE these medications   acetaminophen 325 MG tablet Commonly known as: TYLENOL Take 650 mg by mouth 3 (three) times daily.   albuterol 108 (90 Base)  MCG/ACT inhaler Commonly known as: VENTOLIN HFA Inhale 2 puffs into the lungs every 6 (six) hours as needed for shortness of breath.   ALPRAZolam 0.25 MG tablet Commonly known as: XANAX Take 1 tablet (0.25 mg total) by mouth every 12 (twelve) hours as  needed for anxiety.   amLODipine 10 MG tablet Commonly known as: NORVASC Take 10 mg by mouth daily.   amoxicillin-clavulanate 500-125 MG tablet Commonly known as: AUGMENTIN Take 1 tablet (500 mg total) by mouth 2 (two) times daily for 2 days.   aspirin EC 81 MG tablet Take 81 mg by mouth daily. Swallow whole.   benazepril 10 MG tablet Commonly known as: LOTENSIN Take 10 mg by mouth daily.   cetirizine 10 MG tablet Commonly known as: ZYRTEC Take 10 mg by mouth daily as needed for allergies.   cholecalciferol 25 MCG (1000 UNIT) tablet Commonly known as: VITAMIN D Take 1,000 Units by mouth daily.   doxycycline 100 MG tablet Commonly known as: VIBRA-TABS Take 1 tablet (100 mg total) by mouth every 12 (twelve) hours for 2 days.   galantamine 8 MG 24 hr capsule Commonly known as: RAZADYNE ER Take 8 mg by mouth daily with breakfast.   metolazone 5 MG tablet Commonly known as: ZAROXOLYN Take 1 tablet (5 mg total) by mouth daily for 15 days. Start taking on: December 05, 2019   Minerin Lotn Apply 1 application topically daily. Apply to bilateral lower extremities   Minerin Lotn Apply 1 application topically daily as needed (dry skin). Apply to bilateral lower extremities   QUEtiapine 25 MG tablet Commonly known as: SEROQUEL Take 25-50 mg by mouth 2 (two) times daily. 25 mg in the morning and 50 mg at bedtime   vitamin B-12 1000 MCG tablet Commonly known as: CYANOCOBALAMIN Take 1,000 mcg by mouth daily.            Discharge Care Instructions  (From admission, onward)         Start     Ordered   12/04/19 0000  Discharge wound care:       Comments: RN dressing changes   12/04/19 0940          Contact information for follow-up providers    Danella Penton, MD Follow up in 1 week(s).   Specialty: Internal Medicine Contact information: [phone] [facility] [address] [facility] [place] [address] [phone]            Contact  information for after-discharge care    Destination    [facility] Preferred SNF .   Service: Skilled Nursing Contact information: [address] [place] [address] [phone]                  Signed: Marrion Coy 12/04/2019, 9:40 AM

## 2019-12-04 NOTE — Progress Notes (Signed)
Called facility patient is being d/c to twice and left voice message for them to call me back for report on patient since no one answered

## 2019-12-04 NOTE — NC FL2 (Addendum)
[place] MEDICAID FL2 LEVEL OF CARE SCREENING TOOL     IDENTIFICATION  Patient Name: Dominique Walton Birthdate: March 27, 1927 Sex: female Admission Date (Current Location): 11/26/2019  [place] and [place] Number:  Chiropodist and Address:  [facility], [address], [place], [place] [address]      Provider Number: [id number]  Attending Physician Name and Address:  Marrion Coy, MD  Relative Name and Phone Number:       Current Level of Care: Hospital Recommended Level of Care: Memory Care, Assisted Living Facility (with hospice through [facility]) Prior Approval Number:    Date Approved/Denied:   PASRR Number:  Discharge Plan: Other (Comment) [facility])    Current Diagnoses: Patient Active Problem List   Diagnosis Date Noted  . Adult failure to thrive 12/03/2019  . Aortic stenosis, severe 12/01/2019  . Acute on chronic diastolic CHF (congestive heart failure) (HCC) 12/01/2019  . Hypernatremia 12/01/2019  . Alzheimer's dementia (HCC) 11/28/2019  . Essential hypertension 11/28/2019  . Cellulitis of left leg 11/26/2019    Orientation RESPIRATION BLADDER Height & Weight     Self  O2 (Nasal Canula 1 L) Indwelling catheter, Incontinent Weight: 135 lb 12.8 oz (61.6 kg) Height:  5\' 3"  (160 cm)  BEHAVIORAL SYMPTOMS/MOOD NEUROLOGICAL BOWEL NUTRITION STATUS   (None)  (Alzheimer's dementia) Incontinent Diet (Low sodium, heart healthy)  AMBULATORY STATUS COMMUNICATION OF NEEDS Skin   Extensive Assist Verbally Bruising, Other (Comment) (Cellulitis)                       Personal Care Assistance Level of Assistance  Bathing, Feeding, Dressing Bathing Assistance: Limited assistance Feeding assistance: Limited assistance Dressing Assistance: Limited assistance     Functional Limitations Info  Sight, Hearing, Speech Sight Info: Adequate Hearing Info: Adequate Speech Info: Adequate     SPECIAL CARE FACTORS FREQUENCY          Contractures Contractures Info: Not present    Additional Factors Info  Code Status, Allergies Code Status Info: DNR Allergies Info: NKDA           Current Medications (12/04/2019):  This is the current hospital active medication list Current Facility-Administered Medications  Medication Dose Route Frequency Provider Last Rate Last Admin  . acetaminophen (TYLENOL) tablet 650 mg  650 mg Oral Q6H PRN 12/06/2019, MD   650 mg at 12/03/19 1423   Or  . acetaminophen (TYLENOL) suppository 650 mg  650 mg Rectal Q6H PRN 12/05/19, MD      . albuterol (VENTOLIN HFA) 108 (90 Base) MCG/ACT inhaler 2 puff  2 puff Inhalation Q6H PRN Venora Maples, MD      . ALPRAZolam Venora Maples) tablet 0.25 mg  0.25 mg Oral Daily PRN Prudy Feeler, MD   0.25 mg at 12/02/19 1128  . amLODipine (NORVASC) tablet 10 mg  10 mg Oral Daily 12/04/19, MD   10 mg at 12/04/19 12/06/19  . amoxicillin-clavulanate (AUGMENTIN) 500-125 MG per tablet 500 mg  1 tablet Oral BID [id number], MD   500 mg at 12/04/19 0910  . aspirin EC tablet 81 mg  81 mg Oral Daily 12/06/19, MD   81 mg at 12/04/19 12/06/19  . benazepril (LOTENSIN) tablet 10 mg  10 mg Oral Daily 0762, MD   10 mg at 12/04/19 0910  . Chlorhexidine Gluconate Cloth 2 % PADS 6 each  6  each Topical Daily Marrion Coy, MD   6 each at 12/03/19 1133  . cholecalciferol (VITAMIN D) tablet 1,000 Units  1,000 Units Oral Daily Venora Maples, MD   1,000 Units at 12/04/19 [phone]  . collagenase (SANTYL) ointment   Topical Daily Marrion Coy, MD   Given at 12/03/19 1045  . doxycycline (VIBRA-TABS) tablet 100 mg  100 mg Oral Q12H Marrion Coy, MD   100 mg at 12/04/19 0909  . enoxaparin (LOVENOX) injection 40 mg  40 mg Subcutaneous Q24H Venora Maples, MD   40 mg at 12/03/19 2109  . galantamine (RAZADYNE ER) 24 hr capsule 8 mg  8 mg Oral Q breakfast Venora Maples, MD   8 mg at 12/04/19  0910  . haloperidol lactate (HALDOL) injection 5 mg  5 mg Intramuscular Q6H PRN Marrion Coy, MD   5 mg at 12/02/19 1801  . LORazepam (ATIVAN) injection 2 mg  2 mg Intramuscular Once Manuela Schwartz, NP      . metolazone (ZAROXOLYN) tablet 5 mg  5 mg Oral Daily Marrion Coy, MD   5 mg at 12/04/19 0909  . ondansetron (ZOFRAN) tablet 4 mg  4 mg Oral Q6H PRN Venora Maples, MD       Or  . ondansetron [facility]) injection 4 mg  4 mg Intravenous Q6H PRN Venora Maples, MD      . polyethylene glycol (MIRALAX / GLYCOLAX) packet 17 g  17 g Oral Daily PRN Venora Maples, MD      . QUEtiapine (SEROQUEL) tablet 25 mg  25 mg Oral QHS Marrion Coy, MD   25 mg at 12/03/19 2102  . traZODone (DESYREL) tablet 50 mg  50 mg Oral QHS PRN Venora Maples, MD   50 mg at 11/30/19 2053  . vitamin B-12 (CYANOCOBALAMIN) tablet 1,000 mcg  1,000 mcg Oral Daily Venora Maples, MD   1,000 mcg at 12/04/19 0909     Discharge Medications: STOP taking these medications       cloNIDine 0.1 MG tablet Commonly known as: CATAPRES   furosemide 40 MG tablet Commonly known as: LASIX   ibuprofen 200 MG tablet Commonly known as: ADVIL             TAKE these medications       acetaminophen 325 MG tablet Commonly known as: TYLENOL Take 650 mg by mouth 3 (three) times daily.   albuterol 108 (90 Base) MCG/ACT inhaler Commonly known as: VENTOLIN HFA Inhale 2 puffs into the lungs every 6 (six) hours as needed for shortness of breath.   ALPRAZolam 0.25 MG tablet Commonly known as: XANAX Take 1 tablet (0.25 mg total) by mouth every 12 (twelve) hours as needed for anxiety.   amLODipine 10 MG tablet Commonly known as: NORVASC Take 10 mg by mouth daily.   amoxicillin-clavulanate 500-125 MG tablet Commonly known as: AUGMENTIN Take 1 tablet (500 mg total) by mouth 2 (two) times daily for 2 days.   aspirin EC 81 MG tablet Take 81 mg by mouth daily. Swallow whole.   benazepril 10 MG  tablet Commonly known as: LOTENSIN Take 10 mg by mouth daily.   cetirizine 10 MG tablet Commonly known as: ZYRTEC Take 10 mg by mouth daily as needed for allergies.   cholecalciferol 25 MCG (1000 UNIT) tablet Commonly known as: VITAMIN D Take 1,000 Units by mouth daily.   doxycycline 100 MG tablet Commonly known as: VIBRA-TABS Take 1 tablet (100 mg total) by mouth every  12 (twelve) hours for 2 days.   galantamine 8 MG 24 hr capsule Commonly known as: RAZADYNE ER Take 8 mg by mouth daily with breakfast.   metolazone 5 MG tablet Commonly known as: ZAROXOLYN Take 1 tablet (5 mg total) by mouth daily for 15 days. Start taking on: December 05, 2019   Minerin Lotn Apply 1 application topically daily. Apply to bilateral lower extremities   Minerin Lotn Apply 1 application topically daily as needed (dry skin). Apply to bilateral lower extremities   QUEtiapine 25 MG tablet Commonly known as: SEROQUEL Take 25-50 mg by mouth 2 (two) times daily. 25 mg in the morning and 50 mg at bedtime   vitamin B-12 1000 MCG tablet Commonly known as: CYANOCOBALAMIN Take 1,000 mcg by mouth daily.     Relevant Imaging Results:  Relevant Lab Results:   Additional Information SS#: [id number]  Margarito Liner, LCSW

## 2020-02-04 DEATH — deceased

## 2021-05-03 IMAGING — CT CT CERVICAL SPINE W/O CM
3 of 5 series · 11 of 33 positions shown, 13 images · non-contrast
Comparison: CT head without contrast 10/03/2018

CLINICAL DATA: Head trauma.  Falls.

EXAM:
CT HEAD WITHOUT CONTRAST
CT CERVICAL SPINE WITHOUT CONTRAST
TECHNIQUE: Multidetector CT imaging of the head and cervical spine was
performed following the standard protocol without intravenous
contrast. Multiplanar CT image reconstructions of the cervical spine
were also generated.

[Series 8: sagittal bone · sagittal · 0.21mm/px · 5 of 52 slices shown, 6 images]
[im 18/52  bone]
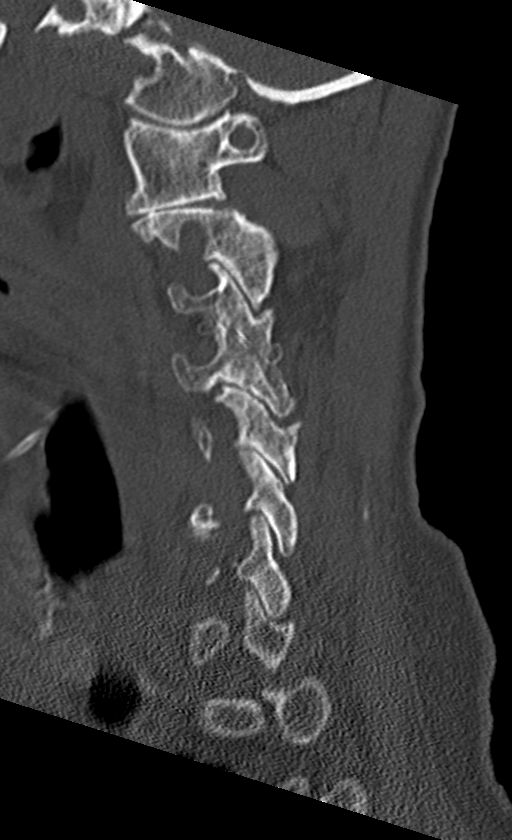
[im 22/52  bone]
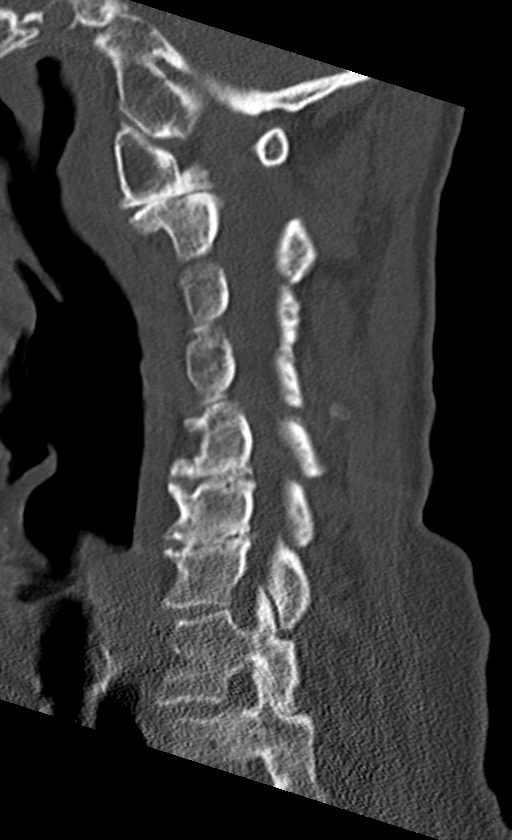
[im 26/52  soft-tissue]
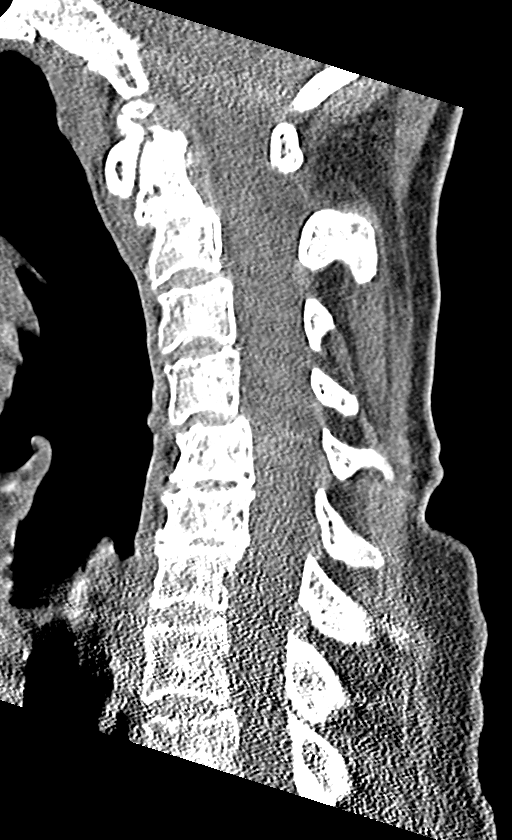
[im 26/52  bone]
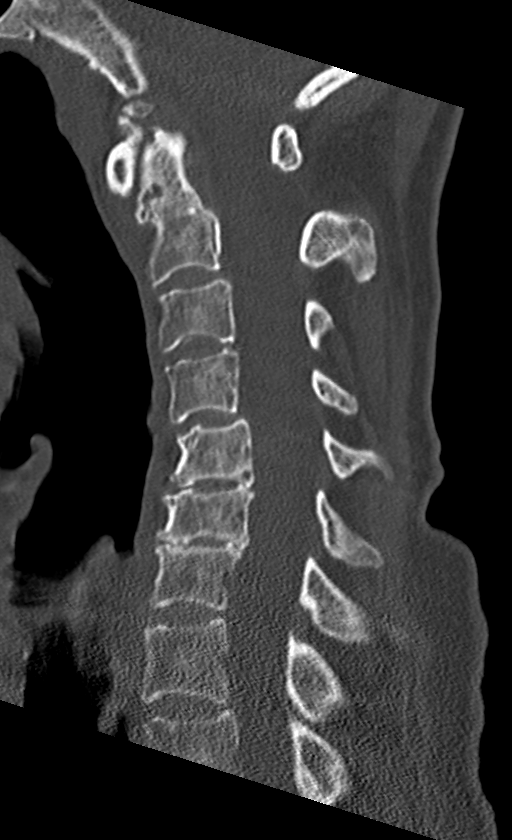
[im 30/52  bone]
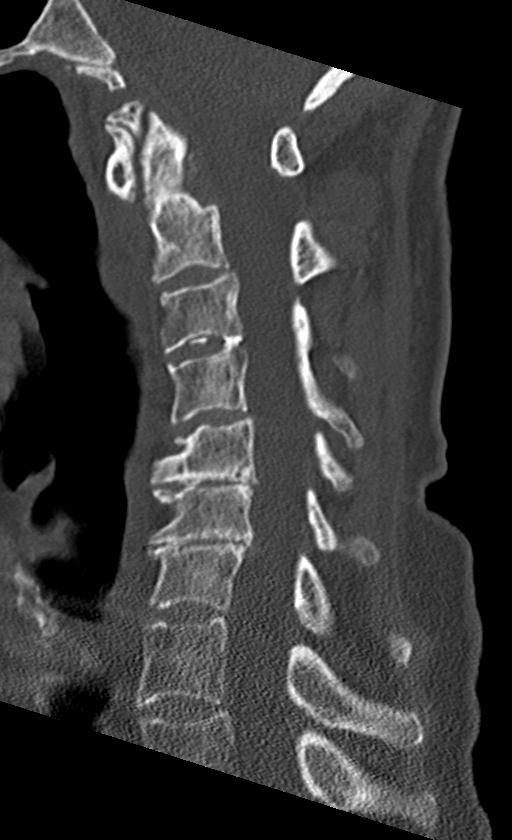
[im 35/52  bone]
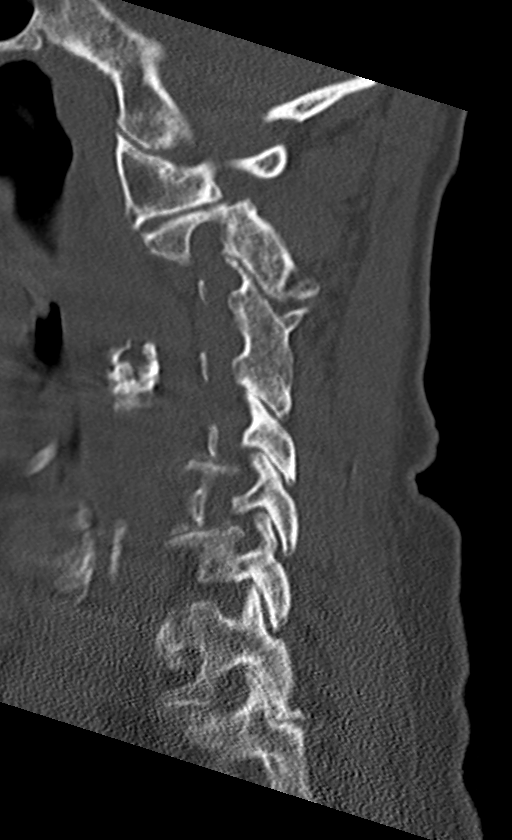

[Series 9: coronal bone · coronal · 0.20mm/px · 3 of 54 slices shown]
[im 11/54  bone]
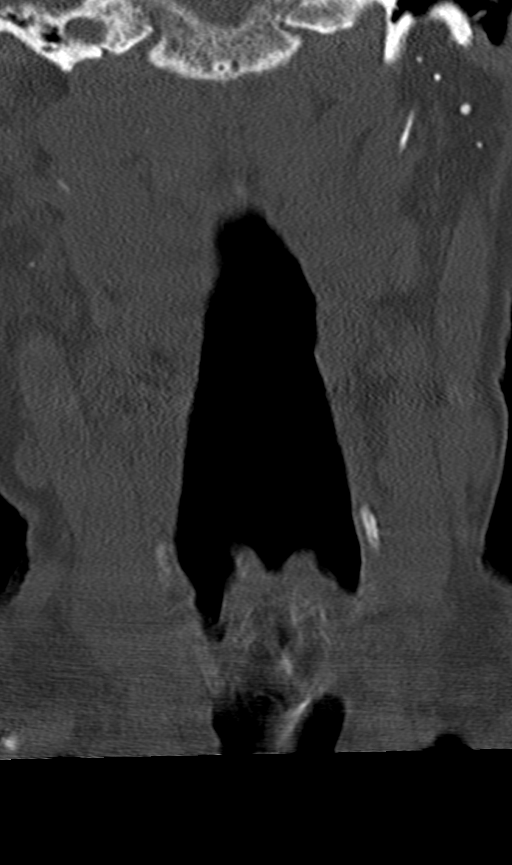
[im 22/54  bone]
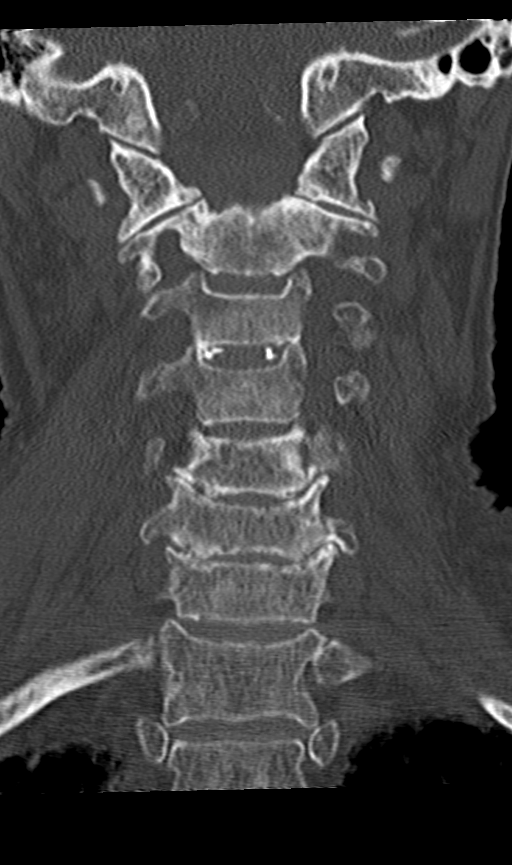
[im 32/54  bone]
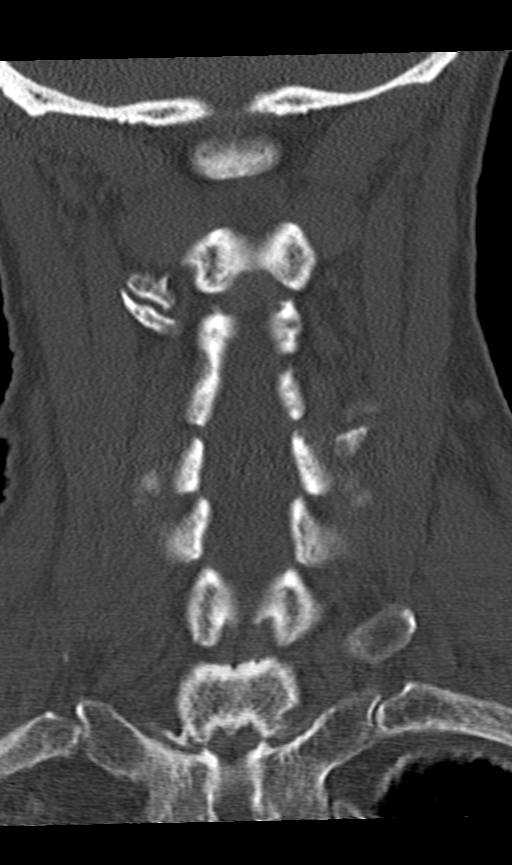

[Series 10: orthogonal bone · axial · 0.20mm/px · z∈[-201,-117]mm · 3 of 88 slices shown, 4 images]
[im 22/88  soft-tissue]
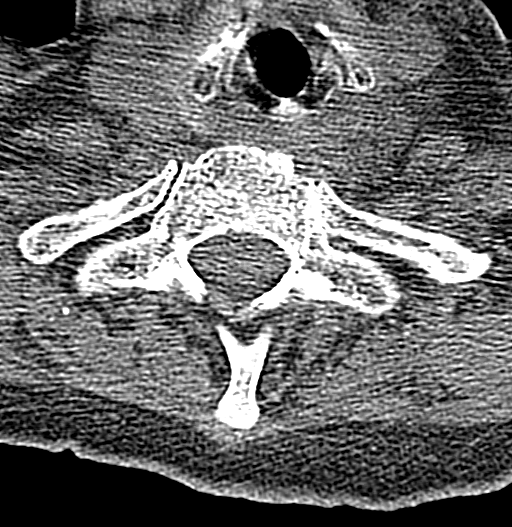
[im 22/88  bone]
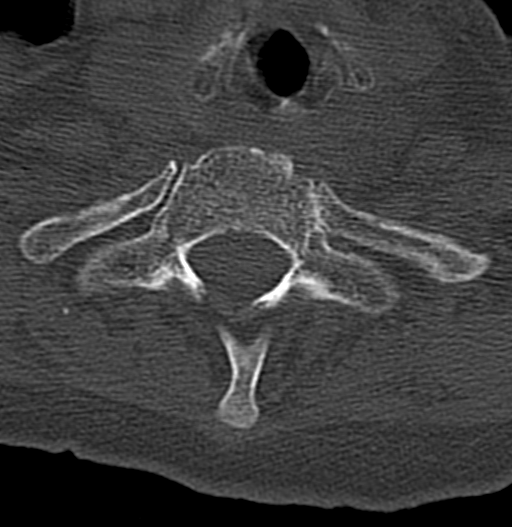
[im 44/88  bone]
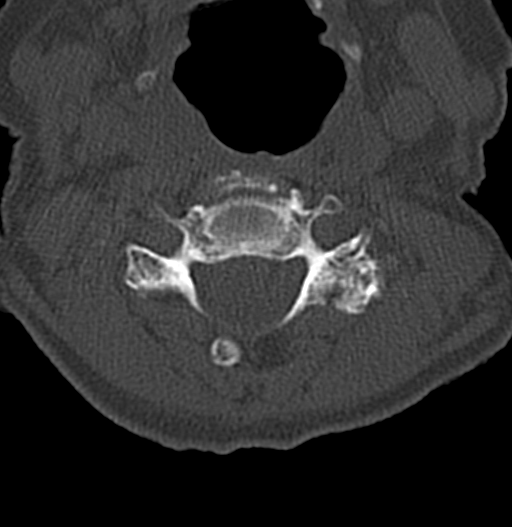
[im 66/88  bone]
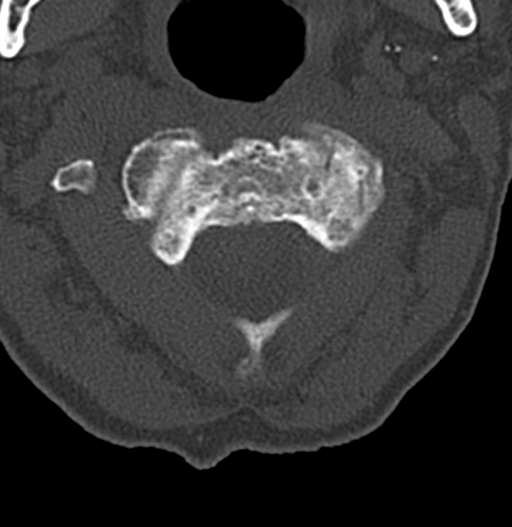

[11 of 33 positions shown; findings below may reference images not displayed]

FINDINGS: CT HEAD FINDINGS

Brain: Atrophy and white matter disease is stable. No acute infarct,
hemorrhage, or mass lesion is present. The ventricles are
proportionate to the degree of atrophy. The basal ganglia are
intact. Insular ribbon is normal bilaterally. The brainstem and
cerebellum are within normal limits.

Vascular: Atherosclerotic calcifications are present in the
cavernous internal carotid arteries bilaterally. No hyperdense
vessel is present.

Skull: Left frontal scalp hematoma is present. No underlying
fracture is present. No significant foreign body is present.
Calvarium is intact. No other significant soft tissue injury is
present.

Sinuses/Orbits: The paranasal sinuses and mastoid air cells are
clear. The globes and orbits are within normal limits.

CT CERVICAL SPINE FINDINGS

Alignment: Grade 1 anterolisthesis is present at C2-3 and C4-5.
Cervical lordosis is noted.

Skull base and vertebrae: Degenerative changes are noted at C1-2.
Craniocervical junction is normal. Remote healed dens fracture is
present. No acute fractures are present. Vertebral body heights are
maintained.

Soft tissues and spinal canal: No prevertebral fluid or swelling. No
visible canal hematoma. Dense atherosclerotic calcifications are
present in the carotid bifurcations bilaterally. Soft tissues are
otherwise unremarkable.

Disc levels: Uncovertebral and facet disease contribute to moderate
foraminal narrowing bilaterally at C2-3, C5-6, greatest at C6-7.

Upper chest: Lung apices are clear. Thoracic inlet is within normal
limits.
IMPRESSION: 1. Left frontal scalp hematoma without underlying fracture.
2. Stable atrophy and white matter disease. This likely reflects the
sequela of chronic microvascular ischemia.
3. No acute intracranial abnormality or significant interval change.
4. Remote healed dens fracture.
5. Multilevel degenerative changes of the cervical spine as
described.
6. No acute fracture or traumatic subluxation in the cervical spine.
